# Patient Record
Sex: Female | Born: 1970 | Race: Black or African American | Hispanic: No | Smoking: Never smoker
Health system: Southern US, Community
[De-identification: ages and names within clinical notes are randomized; demographics above are authoritative.]

## PROBLEM LIST (undated history)

## (undated) HISTORY — PX: TUBAL LIGATION: SHX77

## (undated) HISTORY — PX: OTHER SURGICAL HISTORY: SHX169

## (undated) HISTORY — PX: EYE SURGERY: SHX253

---

## 2000-04-18 ENCOUNTER — Other Ambulatory Visit: Admission: RE | Admit: 2000-04-18 | Discharge: 2000-04-18 | Payer: Self-pay | Admitting: *Deleted

## 2000-08-08 ENCOUNTER — Inpatient Hospital Stay (HOSPITAL_COMMUNITY): Admission: AD | Admit: 2000-08-08 | Discharge: 2000-08-08 | Payer: Self-pay | Admitting: Obstetrics and Gynecology

## 2000-10-31 ENCOUNTER — Inpatient Hospital Stay (HOSPITAL_COMMUNITY): Admission: AD | Admit: 2000-10-31 | Discharge: 2000-11-02 | Payer: Self-pay | Admitting: Obstetrics and Gynecology

## 2002-05-15 ENCOUNTER — Other Ambulatory Visit: Admission: RE | Admit: 2002-05-15 | Discharge: 2002-05-15 | Payer: Self-pay | Admitting: Obstetrics and Gynecology

## 2003-07-07 ENCOUNTER — Other Ambulatory Visit: Admission: RE | Admit: 2003-07-07 | Discharge: 2003-07-07 | Payer: Self-pay | Admitting: Obstetrics and Gynecology

## 2004-07-07 ENCOUNTER — Other Ambulatory Visit: Admission: RE | Admit: 2004-07-07 | Discharge: 2004-07-07 | Payer: Self-pay | Admitting: Obstetrics and Gynecology

## 2004-09-03 ENCOUNTER — Ambulatory Visit (HOSPITAL_COMMUNITY): Admission: RE | Admit: 2004-09-03 | Discharge: 2004-09-03 | Payer: Self-pay | Admitting: Obstetrics and Gynecology

## 2004-12-14 ENCOUNTER — Other Ambulatory Visit: Admission: RE | Admit: 2004-12-14 | Discharge: 2004-12-14 | Payer: Self-pay | Admitting: Obstetrics and Gynecology

## 2012-07-15 ENCOUNTER — Emergency Department (HOSPITAL_COMMUNITY)
Admission: EM | Admit: 2012-07-15 | Discharge: 2012-07-15 | Disposition: A | Discharge status: Home / Self Care | Payer: BC Managed Care – PPO | Attending: Emergency Medicine | Admitting: Emergency Medicine

## 2012-07-15 ENCOUNTER — Encounter (HOSPITAL_COMMUNITY): Payer: Self-pay | Admitting: Emergency Medicine

## 2012-07-15 DIAGNOSIS — R1110 Vomiting, unspecified: Secondary | ICD-10-CM

## 2012-07-15 DIAGNOSIS — R109 Unspecified abdominal pain: Secondary | ICD-10-CM | POA: Insufficient documentation

## 2012-07-15 DIAGNOSIS — E860 Dehydration: Secondary | ICD-10-CM | POA: Insufficient documentation

## 2012-07-15 DIAGNOSIS — R197 Diarrhea, unspecified: Secondary | ICD-10-CM | POA: Insufficient documentation

## 2012-07-15 LAB — POCT I-STAT, CHEM 8
BUN: 17 mg/dL (ref 6–23)
Calcium, Ion: 1.23 mmol/L (ref 1.12–1.23)
Creatinine, Ser: 0.9 mg/dL (ref 0.50–1.10)
Glucose, Bld: 120 mg/dL — ABNORMAL HIGH (ref 70–99)
TCO2: 26 mmol/L (ref 0–100)

## 2012-07-15 MED ORDER — SODIUM CHLORIDE 0.9 % IV SOLN: 1000.0000 mL | INTRAVENOUS | Status: DC

## 2012-07-15 MED ORDER — SODIUM CHLORIDE 0.9 % IV SOLN
1000.0000 mL | Freq: Once | INTRAVENOUS | Status: AC
  Administered 2012-07-15: 1000 mL via INTRAVENOUS

## 2012-07-15 MED ORDER — ONDANSETRON HCL 4 MG/2ML IJ SOLN
4.0000 mg | Freq: Once | INTRAMUSCULAR | Status: AC
  Administered 2012-07-15: 4 mg via INTRAVENOUS
  Filled 2012-07-15: qty 2

## 2012-07-15 MED ORDER — LOPERAMIDE HCL 2 MG PO CAPS
4.0000 mg | ORAL_CAPSULE | Freq: Once | ORAL | Status: AC
  Administered 2012-07-15: 4 mg via ORAL
  Filled 2012-07-15: qty 2

## 2012-07-15 MED ORDER — SODIUM CHLORIDE 0.9 % IV BOLUS (SEPSIS)
1000.0000 mL | Freq: Once | INTRAVENOUS | Status: AC
  Administered 2012-07-15: 1000 mL via INTRAVENOUS

## 2012-07-15 MED ORDER — ONDANSETRON 8 MG PO TBDP: 8.0000 mg | ORAL_TABLET | Freq: Three times a day (TID) | ORAL | Status: DC | PRN

## 2012-07-15 NOTE — ED Provider Notes (Signed)
History     CSN: [id number]  Arrival date & time 07/15/12  1140   First MD Initiated Contact with Patient 07/15/12 1213      Chief Complaint  Patient presents with  . Emesis  . Diarrhea  . Abdominal Pain    (Consider location/radiation/quality/duration/timing/severity/associated sxs/prior treatment) HPI  Patient reports about 3 AM she woke up and started having vomiting and diarrhea sometimes both at the same time. She's had about 6 episodes of vomiting about the same diarrhea. She states she the diarrhea is watery. She states she gets abdominal pain before she has vomiting or diarrhea and then it goes away. She denies feeling dizzy or lightheaded but does state she feels weak. She denies having any fever. She reports her daughter had similar symptoms last week.  PCP none  History reviewed. No pertinent past medical history.  Past Surgical History  Procedure Date  . Eye surgery   . Uterine ablation   . Tubal ligation     No family history on file.  History  Substance Use Topics  . Smoking status: Never Smoker   . Smokeless tobacco: Not on file  . Alcohol Use: No  employed Lives at home Lives with spouse  OB History    Grav Para Term Preterm Abortions TAB SAB Ect Mult Living                  Review of Systems  All other systems reviewed and are negative.    Allergies  Review of patient's allergies indicates no known allergies.  Home Medications   none  BP 115/67  Pulse 100  Temp 98.5 F (36.9 C) (Oral)  Resp 16  SpO2 97%  LMP 06/15/2012  Vital signs normal    Physical Exam  Nursing note and vitals reviewed. Constitutional: She is oriented to person, place, and time. She appears well-developed and well-nourished.  Non-toxic appearance. She does not appear ill. No distress.  HENT:  Head: Normocephalic and atraumatic.  Right Ear: External ear normal.  Left Ear: External ear normal.  Nose: Nose normal. No mucosal edema or rhinorrhea.    Mouth/Throat: Mucous membranes are normal. No dental abscesses or uvula swelling.       Dry tongue  Eyes: Conjunctivae normal and EOM are normal. Pupils are equal, round, and reactive to light.  Neck: Normal range of motion and full passive range of motion without pain. Neck supple.  Cardiovascular: Normal rate, regular rhythm and normal heart sounds.  Exam reveals no gallop and no friction rub.   No murmur heard. Pulmonary/Chest: Effort normal and breath sounds normal. No respiratory distress. She has no wheezes. She has no rhonchi. She has no rales. She exhibits no tenderness and no crepitus.  Abdominal: Soft. Normal appearance and bowel sounds are normal. She exhibits no distension. There is tenderness. There is no rebound and no guarding.       Mild diffuse soreness  Musculoskeletal: Normal range of motion. She exhibits no edema and no tenderness.       Moves all extremities well.   Neurological: She is alert and oriented to person, place, and time. She has normal strength. No cranial nerve deficit.  Skin: Skin is warm, dry and intact. No rash noted. No erythema. No pallor.  Psychiatric: She has a normal mood and affect. Her speech is normal and behavior is normal. Her mood appears not anxious.    ED Course  Procedures (including critical care time)   Medications  0.9 %  sodium chloride infusion (0 mL Intravenous Stopped 07/15/12 1416)    Followed by  0.9 %  sodium chloride infusion (not administered)  ondansetron (ZOFRAN ODT) 8 MG disintegrating tablet (not administered)  ondansetron (ZOFRAN) injection 4 mg (4 mg Intravenous Given 07/15/12 1316)  loperamide (IMODIUM) capsule 4 mg (4 mg Oral Given 07/15/12 1317)  sodium chloride 0.9 % bolus 1,000 mL (1000 mL Intravenous New Bag/Given 07/15/12 1425)   14:30 Feels much better, has drank two cups of fluids.   Results for orders placed during the hospital encounter of 07/15/12  POCT I-STAT, CHEM 8      Component Value Range   Sodium  141  135 - 145 mEq/L   Potassium 5.9 (*) 3.5 - 5.1 mEq/L   Chloride 109  96 - 112 mEq/L   BUN 17  6 - 23 mg/dL   Creatinine, Ser 1.61  0.50 - 1.10 mg/dL   Glucose, Bld 096 (*) 70 - 99 mg/dL   Calcium, Ion 0.45  4.09 - 1.23 mmol/L   TCO2 26  0 - 100 mmol/L   Hemoglobin 14.3  12.0 - 15.0 g/dL   HCT 81.1  91.4 - 78.2 %   Laboratory interpretation all normal except hyperkalemia most likely hemolyzed     1. Vomiting and diarrhea   2. Dehydration    New Prescriptions   ONDANSETRON (ZOFRAN ODT) 8 MG DISINTEGRATING TABLET    Take 1 tablet (8 mg total) by mouth every 8 (eight) hours as needed for nausea.    Plan discharge  Devoria Albe, MD, FACEP    MDM          Ward Givens, MD 07/15/12 [phone]

## 2012-07-15 NOTE — ED Notes (Signed)
Pt from home c/o N/V/D and abd pain since 3am. Pt reports that her daughter had "a stomach virus" on Friday. Pt denies blood in stool, emesis, CP or SOB. Pt in NAD and A&O.

## 2017-12-23 ENCOUNTER — Encounter (HOSPITAL_COMMUNITY): Payer: Self-pay | Admitting: Emergency Medicine

## 2017-12-23 ENCOUNTER — Other Ambulatory Visit: Payer: Self-pay

## 2017-12-23 ENCOUNTER — Emergency Department (HOSPITAL_COMMUNITY)
Admission: EM | Admit: 2017-12-23 | Discharge: 2017-12-23 | Disposition: A | Discharge status: Home / Self Care | Payer: 59 | Attending: Emergency Medicine | Admitting: Emergency Medicine

## 2017-12-23 DIAGNOSIS — Z79899 Other long term (current) drug therapy: Secondary | ICD-10-CM | POA: Diagnosis not present

## 2017-12-23 DIAGNOSIS — K0889 Other specified disorders of teeth and supporting structures: Secondary | ICD-10-CM | POA: Insufficient documentation

## 2017-12-23 MED ORDER — HYDROCODONE-ACETAMINOPHEN 5-325 MG PO TABS: 1.0000 | ORAL_TABLET | ORAL | 0 refills | Status: DC | PRN

## 2017-12-23 NOTE — ED Triage Notes (Signed)
Patient states she has an appointment to get tooth pulled on Monday but is having pain on the right lower bottom of her mouth.

## 2017-12-23 NOTE — ED Provider Notes (Signed)
[facility]-EMERGENCY DEPT Provider Note: Julia Dell, MD, FACEP  CSN: [id number] MRN: [id number] ARRIVAL: 12/23/17 at 0303 ROOM: [facility]   CHIEF COMPLAINT  Dental Pain   HISTORY OF PRESENT ILLNESS  12/23/17 3:21 AM Julia Mccullough is a 47 y.o. female who is had pain in her left lower second molar for the past week.  She saw her dentist who told her she could either have the tooth extracted or have a root canal.  She elected to have it extracted which is scheduled to be done the day after tomorrow.  Her pain had been adequately controlled with Aleve at that point but the pain has subsequently worsened and Aleve no longer controls it.  She has also applied Anbesol without adequate relief.  Pain is currently moderate to severe, worse with eating.   History reviewed. No pertinent past medical history.  Past Surgical History:  Procedure Laterality Date  . EYE SURGERY    . TUBAL LIGATION    . uterine ablation      History reviewed. No pertinent family history.  Social History   Tobacco Use  . Smoking status: Never Smoker  . Smokeless tobacco: Never Used  Substance Use Topics  . Alcohol use: No  . Drug use: No    Prior to Admission medications   Medication Sig Start Date End Date Taking? Authorizing Provider  ondansetron (ZOFRAN ODT) 8 MG disintegrating tablet Take 1 tablet (8 mg total) by mouth every 8 (eight) hours as needed for nausea. 07/15/12   Devoria Albe, MD    Allergies Patient has no known allergies.   REVIEW OF SYSTEMS  Negative except as noted here or in the History of Present Illness.   PHYSICAL EXAMINATION  Initial Vital Signs Blood pressure 134/80, pulse 90, temperature 98.7 F (37.1 C), temperature source Oral, resp. rate 16, height 5\' 9"  (1.753 m), weight 80.7 kg (178 lb), SpO2 97 %.  Examination General: Well-developed, well-nourished female in no acute distress; appearance consistent with age of record HENT: normocephalic; atraumatic;  normal-appearing left lower second molar without adjacent swelling or erythema of the gum Eyes: Normal appearance Neck: supple; no lymphadenopathy Heart: regular rate and rhythm Lungs: clear to auscultation bilaterally Abdomen: soft; nondistended; nontender Extremities: No deformity; full range of motion Neurologic: Awake, alert and oriented; motor function intact in all extremities and symmetric; no facial droop Skin: Warm and dry Psychiatric: Normal mood and affect   RESULTS  Summary of this visit's results, reviewed by myself:   EKG Interpretation  Date/Time:    Ventricular Rate:    PR Interval:    QRS Duration:   QT Interval:    QTC Calculation:   R Axis:     Text Interpretation:        Laboratory Studies: No results found for this or any previous visit (from the past 24 hour(s)). Imaging Studies: No results found.  ED COURSE and MDM  Nursing notes and initial vitals signs, including pulse oximetry, reviewed.  Vitals:   12/23/17 0312 12/23/17 0313  BP: 134/80   Pulse: 90   Resp: 16   Temp: 98.7 F (37.1 C)   TempSrc: Oral   SpO2: 97%   Weight:  80.7 kg (178 lb)  Height:  5\' 9"  (1.753 m)   The patient was advised to continue Aleve and we will add a short course of hydrocodone.  Consultation with the [facility] state controlled substances database reveals the patient has received no opioid prescriptions in the past 2 years.  PROCEDURES    ED DIAGNOSES     ICD-10-CM   1. Pain, dental K08.89        Paula LibraMolpus, Ashe Graybeal, MD 12/23/17 [phone]

## 2017-12-30 ENCOUNTER — Emergency Department (HOSPITAL_COMMUNITY)
Admission: EM | Admit: 2017-12-30 | Discharge: 2017-12-30 | Disposition: A | Discharge status: Home / Self Care | Payer: 59 | Attending: Emergency Medicine | Admitting: Emergency Medicine

## 2017-12-30 ENCOUNTER — Encounter (HOSPITAL_COMMUNITY)
Admission: EM | Disposition: A | Discharge status: Home / Self Care | Payer: Self-pay | Source: Home / Self Care | Attending: Emergency Medicine

## 2017-12-30 ENCOUNTER — Emergency Department (HOSPITAL_COMMUNITY): Payer: 59 | Admitting: Certified Registered Nurse Anesthetist

## 2017-12-30 ENCOUNTER — Emergency Department (HOSPITAL_COMMUNITY): Payer: 59

## 2017-12-30 ENCOUNTER — Other Ambulatory Visit: Payer: Self-pay

## 2017-12-30 ENCOUNTER — Encounter (HOSPITAL_COMMUNITY): Payer: Self-pay | Admitting: Emergency Medicine

## 2017-12-30 DIAGNOSIS — L0201 Cutaneous abscess of face: Secondary | ICD-10-CM | POA: Insufficient documentation

## 2017-12-30 DIAGNOSIS — K122 Cellulitis and abscess of mouth: Secondary | ICD-10-CM

## 2017-12-30 DIAGNOSIS — R220 Localized swelling, mass and lump, head: Secondary | ICD-10-CM | POA: Diagnosis present

## 2017-12-30 HISTORY — PX: INCISION AND DRAINAGE ABSCESS: SHX5864

## 2017-12-30 LAB — COMPREHENSIVE METABOLIC PANEL
ALT: 18 U/L (ref 0–44)
ANION GAP: 12 (ref 5–15)
AST: 18 U/L (ref 15–41)
Albumin: 3.6 g/dL (ref 3.5–5.0)
Alkaline Phosphatase: 108 U/L (ref 38–126)
BUN: 12 mg/dL (ref 6–20)
CHLORIDE: 103 mmol/L (ref 98–111)
CO2: 24 mmol/L (ref 22–32)
Calcium: 9.4 mg/dL (ref 8.9–10.3)
Creatinine, Ser: 0.78 mg/dL (ref 0.44–1.00)
Glucose, Bld: 105 mg/dL — ABNORMAL HIGH (ref 70–99)
POTASSIUM: 4.2 mmol/L (ref 3.5–5.1)
SODIUM: 139 mmol/L (ref 135–145)
Total Bilirubin: 0.8 mg/dL (ref 0.3–1.2)
Total Protein: 7.8 g/dL (ref 6.5–8.1)

## 2017-12-30 LAB — CBC WITH DIFFERENTIAL/PLATELET
ABS IMMATURE GRANULOCYTES: 0 10*3/uL (ref 0.0–0.1)
Basophils Absolute: 0 10*3/uL (ref 0.0–0.1)
Basophils Relative: 0 %
Eosinophils Absolute: 0 10*3/uL (ref 0.0–0.7)
Eosinophils Relative: 0 %
HEMATOCRIT: 38.8 % (ref 36.0–46.0)
Hemoglobin: 12.4 g/dL (ref 12.0–15.0)
IMMATURE GRANULOCYTES: 0 %
LYMPHS ABS: 1.9 10*3/uL (ref 0.7–4.0)
LYMPHS PCT: 22 %
MCH: 29 pg (ref 26.0–34.0)
MCHC: 32 g/dL (ref 30.0–36.0)
MCV: 90.9 fL (ref 78.0–100.0)
MONOS PCT: 10 %
Monocytes Absolute: 0.9 10*3/uL (ref 0.1–1.0)
NEUTROS ABS: 5.9 10*3/uL (ref 1.7–7.7)
NEUTROS PCT: 68 %
PLATELETS: 365 10*3/uL (ref 150–400)
RBC: 4.27 MIL/uL (ref 3.87–5.11)
RDW: 12.7 % (ref 11.5–15.5)
WBC: 8.7 10*3/uL (ref 4.0–10.5)

## 2017-12-30 LAB — I-STAT CG4 LACTIC ACID, ED
LACTIC ACID, VENOUS: 0.58 mmol/L (ref 0.5–1.9)
LACTIC ACID, VENOUS: 0.84 mmol/L (ref 0.5–1.9)

## 2017-12-30 LAB — I-STAT BETA HCG BLOOD, ED (MC, WL, AP ONLY): I-stat hCG, quantitative: <5 m[IU]/mL (ref <5)

## 2017-12-30 SURGERY — INCISION AND DRAINAGE, ABSCESS
Anesthesia: General | Site: Neck

## 2017-12-30 MED ORDER — FENTANYL CITRATE (PF) 100 MCG/2ML IJ SOLN
25.0000 ug | INTRAMUSCULAR | Status: DC | PRN
  Administered 2017-12-30: 50 ug via INTRAVENOUS

## 2017-12-30 MED ORDER — VANCOMYCIN HCL 10 G IV SOLR
1500.0000 mg | Freq: Once | INTRAVENOUS | Status: AC
  Administered 2017-12-30: 1500 mg via INTRAVENOUS
  Filled 2017-12-30: qty 1500

## 2017-12-30 MED ORDER — 0.9 % SODIUM CHLORIDE (POUR BTL) OPTIME
TOPICAL | Status: DC | PRN
  Administered 2017-12-30: 1000 mL

## 2017-12-30 MED ORDER — LACTATED RINGERS IV SOLN
INTRAVENOUS | Status: DC
  Administered 2017-12-30: 12:00:00 via INTRAVENOUS

## 2017-12-30 MED ORDER — ONDANSETRON HCL 4 MG/2ML IJ SOLN
INTRAMUSCULAR | Status: AC
  Filled 2017-12-30: qty 2

## 2017-12-30 MED ORDER — OXYCODONE HCL 5 MG PO TABS
5.0000 mg | ORAL_TABLET | Freq: Once | ORAL | Status: AC | PRN
  Administered 2017-12-30: 5 mg via ORAL

## 2017-12-30 MED ORDER — FENTANYL CITRATE (PF) 100 MCG/2ML IJ SOLN
INTRAMUSCULAR | Status: AC
  Administered 2017-12-30: 50 ug via INTRAVENOUS
  Filled 2017-12-30: qty 2

## 2017-12-30 MED ORDER — MIDAZOLAM HCL 2 MG/2ML IJ SOLN
INTRAMUSCULAR | Status: DC | PRN
  Administered 2017-12-30: 2 mg via INTRAVENOUS

## 2017-12-30 MED ORDER — ONDANSETRON HCL 4 MG/2ML IJ SOLN
INTRAMUSCULAR | Status: DC | PRN
  Administered 2017-12-30: 4 mg via INTRAVENOUS

## 2017-12-30 MED ORDER — SUCCINYLCHOLINE CHLORIDE 200 MG/10ML IV SOSY
PREFILLED_SYRINGE | INTRAVENOUS | Status: AC
  Filled 2017-12-30: qty 10

## 2017-12-30 MED ORDER — MIDAZOLAM HCL 2 MG/2ML IJ SOLN
INTRAMUSCULAR | Status: AC
  Filled 2017-12-30: qty 2

## 2017-12-30 MED ORDER — PROPOFOL 10 MG/ML IV BOLUS
INTRAVENOUS | Status: AC
  Filled 2017-12-30: qty 20

## 2017-12-30 MED ORDER — SUCCINYLCHOLINE CHLORIDE 200 MG/10ML IV SOSY
PREFILLED_SYRINGE | INTRAVENOUS | Status: DC | PRN
  Administered 2017-12-30: 60 mg via INTRAVENOUS

## 2017-12-30 MED ORDER — PHENYLEPHRINE HCL 10 MG/ML IJ SOLN
INTRAMUSCULAR | Status: DC | PRN
  Administered 2017-12-30: 80 ug via INTRAVENOUS

## 2017-12-30 MED ORDER — FENTANYL CITRATE (PF) 250 MCG/5ML IJ SOLN
INTRAMUSCULAR | Status: AC
  Filled 2017-12-30: qty 5

## 2017-12-30 MED ORDER — DEXAMETHASONE SODIUM PHOSPHATE 10 MG/ML IJ SOLN
10.0000 mg | Freq: Once | INTRAMUSCULAR | Status: AC
  Administered 2017-12-30: 10 mg via INTRAVENOUS
  Filled 2017-12-30: qty 1

## 2017-12-30 MED ORDER — OXYCODONE HCL 5 MG PO TABS
ORAL_TABLET | ORAL | Status: AC
  Administered 2017-12-30: 5 mg via ORAL
  Filled 2017-12-30: qty 1

## 2017-12-30 MED ORDER — LIDOCAINE-EPINEPHRINE 1 %-1:100000 IJ SOLN
INTRAMUSCULAR | Status: DC | PRN
  Administered 2017-12-30: 1.5 mL

## 2017-12-30 MED ORDER — FENTANYL CITRATE (PF) 250 MCG/5ML IJ SOLN
INTRAMUSCULAR | Status: DC | PRN
  Administered 2017-12-30: 100 ug via INTRAVENOUS
  Administered 2017-12-30 (×2): 50 ug via INTRAVENOUS

## 2017-12-30 MED ORDER — PROPOFOL 10 MG/ML IV BOLUS
INTRAVENOUS | Status: DC | PRN
  Administered 2017-12-30: 80 mg via INTRAVENOUS
  Administered 2017-12-30: 30 mg via INTRAVENOUS

## 2017-12-30 MED ORDER — VANCOMYCIN HCL 10 G IV SOLR: 1250.0000 mg | INTRAVENOUS | Status: DC

## 2017-12-30 MED ORDER — OXYCODONE HCL 5 MG/5ML PO SOLN: 5.0000 mg | Freq: Once | ORAL | Status: AC | PRN

## 2017-12-30 MED ORDER — LIDOCAINE-EPINEPHRINE 1 %-1:100000 IJ SOLN
INTRAMUSCULAR | Status: AC
  Filled 2017-12-30: qty 1

## 2017-12-30 MED ORDER — IOHEXOL 300 MG/ML  SOLN
75.0000 mL | Freq: Once | INTRAMUSCULAR | Status: AC | PRN
  Administered 2017-12-30: 75 mL via INTRAVENOUS

## 2017-12-30 MED ORDER — PIPERACILLIN-TAZOBACTAM 3.375 G IVPB 30 MIN
3.3750 g | Freq: Once | INTRAVENOUS | Status: AC
  Administered 2017-12-30: 3.375 g via INTRAVENOUS
  Filled 2017-12-30: qty 50

## 2017-12-30 SURGICAL SUPPLY — 41 items
BLADE SURG 15 STRL LF DISP TIS (BLADE) IMPLANT
BLADE SURG 15 STRL SS (BLADE) ×2
BNDG CONFORM 2 STRL LF (GAUZE/BANDAGES/DRESSINGS) ×2 IMPLANT
BNDG GAUZE ELAST 4 BULKY (GAUZE/BANDAGES/DRESSINGS) ×1 IMPLANT
CATH ROBINSON RED A/P 12FR (CATHETERS) ×2 IMPLANT
COVER SURGICAL LIGHT HANDLE (MISCELLANEOUS) ×4 IMPLANT
CRADLE DONUT ADULT HEAD (MISCELLANEOUS) ×1 IMPLANT
DECANTER SPIKE VIAL GLASS SM (MISCELLANEOUS) ×1 IMPLANT
DRAIN PENROSE 1/4X12 LTX STRL (WOUND CARE) ×1 IMPLANT
DRAPE HALF SHEET 40X57 (DRAPES) IMPLANT
DRAPE ORTHO SPLIT 77X108 STRL (DRAPES) ×2
DRAPE SURG ORHT 6 SPLT 77X108 (DRAPES) ×1 IMPLANT
DRSG PAD ABDOMINAL 8X10 ST (GAUZE/BANDAGES/DRESSINGS) IMPLANT
ELECT COATED BLADE 2.86 ST (ELECTRODE) ×2 IMPLANT
ELECT REM PT RETURN 9FT ADLT (ELECTROSURGICAL) ×2
ELECTRODE REM PT RTRN 9FT ADLT (ELECTROSURGICAL) ×1 IMPLANT
GAUZE SPONGE 4X4 12PLY STRL (GAUZE/BANDAGES/DRESSINGS) IMPLANT
GAUZE SPONGE 4X4 16PLY XRAY LF (GAUZE/BANDAGES/DRESSINGS) ×2 IMPLANT
GLOVE BIO SURGEON STRL SZ7.5 (GLOVE) ×2 IMPLANT
GOWN STRL REUS W/ TWL LRG LVL3 (GOWN DISPOSABLE) ×1 IMPLANT
GOWN STRL REUS W/TWL LRG LVL3 (GOWN DISPOSABLE) ×2
KIT BASIN OR (CUSTOM PROCEDURE TRAY) ×2 IMPLANT
KIT TURNOVER KIT B (KITS) ×2 IMPLANT
MARKER SKIN DUAL TIP RULER LAB (MISCELLANEOUS) ×2 IMPLANT
NDL HYPO 25GX1X1/2 BEV (NEEDLE) ×1 IMPLANT
NEEDLE HYPO 25GX1X1/2 BEV (NEEDLE) ×2 IMPLANT
NS IRRIG 1000ML POUR BTL (IV SOLUTION) ×2 IMPLANT
PACK SURGICAL SETUP 50X90 (CUSTOM PROCEDURE TRAY) ×2 IMPLANT
PAD ARMBOARD 7.5X6 YLW CONV (MISCELLANEOUS) ×4 IMPLANT
PENCIL BUTTON HOLSTER BLD 10FT (ELECTRODE) ×2 IMPLANT
RUBBERBAND STERILE (MISCELLANEOUS) IMPLANT
SUT ETHILON 2 0 FS 18 (SUTURE) ×2 IMPLANT
SUT SILK 2 0 SH CR/8 (SUTURE) IMPLANT
SWAB COLLECTION DEVICE MRSA (MISCELLANEOUS) ×2 IMPLANT
SWAB CULTURE ESWAB REG 1ML (MISCELLANEOUS) ×2 IMPLANT
SYR BULB IRRIGATION 50ML (SYRINGE) ×2 IMPLANT
SYR CONTROL 10ML LL (SYRINGE) IMPLANT
TAPE SURG TRANSPORE 1 IN (GAUZE/BANDAGES/DRESSINGS) IMPLANT
TAPE SURGICAL TRANSPORE 1 IN (GAUZE/BANDAGES/DRESSINGS) ×1
TUBE CONNECTING 12X1/4 (SUCTIONS) ×2 IMPLANT
YANKAUER SUCT BULB TIP NO VENT (SUCTIONS) ×2 IMPLANT

## 2017-12-30 NOTE — ED Provider Notes (Signed)
[facility] EMERGENCY DEPARTMENT Provider Note   CSN: [id number] Arrival date & time: 12/30/17  4098     History   Chief Complaint Chief Complaint  Patient presents with  . Oral Swelling    HPI Julia Mccullough is a 47 y.o. female.  HPI 47 year old female with past medical history of recent tooth extraction 2-1/2 weeks ago here with ongoing tooth and neck pain.  The patient states that she had her left lower molar removed earlier this month.  She had increased swelling and has been on and off.  She saw her oral surgeon this week and was given clindamycin and IV steroids.  Since then, she has had moderate, mild, initially gradual worsening of her pain but over the last 24 hours, she developed rapid swelling and pain under her tongue and jaw.  She said associated difficulties and change in her voice.  Denies any shortness of breath.  She feels like the symptoms are fairly rapidly progressing.  Denies any current shortness of breath or stridor.  No fevers.  She said no nausea or vomiting.  Her last meal was this morning.  She is been unable to eat anything other than liquids for the last several weeks.  History reviewed. No pertinent past medical history.  There are no active problems to display for this patient.   Past Surgical History:  Procedure Laterality Date  . EYE SURGERY    . TUBAL LIGATION    . uterine ablation       OB History   None      Home Medications    Prior to Admission medications   Medication Sig Start Date End Date Taking? Authorizing Provider  HYDROcodone-acetaminophen (NORCO) 5-325 MG tablet Take 1 tablet by mouth every 4 (four) hours as needed for severe pain. 12/23/17   Molpus, John, MD    Family History No family history on file.  Social History Social History   Tobacco Use  . Smoking status: Never Smoker  . Smokeless tobacco: Never Used  Substance Use Topics  . Alcohol use: No  . Drug use: No     Allergies   Patient  has no known allergies.   Review of Systems Review of Systems  Constitutional: Positive for fatigue. Negative for chills and fever.  HENT: Positive for dental problem, drooling, sore throat, trouble swallowing and voice change. Negative for congestion and rhinorrhea.   Eyes: Negative for visual disturbance.  Respiratory: Negative for cough, shortness of breath and wheezing.   Cardiovascular: Negative for chest pain and leg swelling.  Gastrointestinal: Negative for abdominal pain, diarrhea, nausea and vomiting.  Genitourinary: Negative for dysuria, flank pain, vaginal bleeding and vaginal discharge.  Musculoskeletal: Negative for neck pain.  Skin: Negative for rash.  Allergic/Immunologic: Negative for immunocompromised state.  Neurological: Negative for syncope and headaches.  Hematological: Does not bruise/bleed easily.  All other systems reviewed and are negative.    Physical Exam Updated Vital Signs BP 115/63 (BP Location: Right Arm)   Pulse 95   Temp 98.2 F (36.8 C) (Oral)   Resp 17   LMP 12/05/2017 (Approximate)   SpO2 98%   Physical Exam  Constitutional: She is oriented to person, place, and time. She appears well-developed and well-nourished. No distress.  HENT:  Head: Normocephalic and atraumatic.  Left molar operative site appears intact with no appreciable gingival abscess.  There is marketed, firm induration of the sublingual and submandibular space.  Significant, 2 finger trismus.  Difficult to assess posterior  oropharynx due to trismus.  No stridor.  Eyes: Conjunctivae are normal.  Neck: Neck supple.  Cardiovascular: Normal rate, regular rhythm and normal heart sounds. Exam reveals no friction rub.  No murmur heard. Pulmonary/Chest: Effort normal and breath sounds normal. No respiratory distress. She has no wheezes. She has no rales.  Abdominal: She exhibits no distension.  Musculoskeletal: She exhibits no edema.  Neurological: She is alert and oriented to  person, place, and time. She exhibits normal muscle tone.  Skin: Skin is warm. Capillary refill takes less than 2 seconds.  Psychiatric: She has a normal mood and affect.  Nursing note and vitals reviewed.    ED Treatments / Results  Labs (all labs ordered are listed, but only abnormal results are displayed) Labs Reviewed  COMPREHENSIVE METABOLIC PANEL - Abnormal; Notable for the following components:      Result Value   Glucose, Bld 105 (*)    All other components within normal limits  CULTURE, BLOOD (ROUTINE X 2)  CULTURE, BLOOD (ROUTINE X 2)  CBC WITH DIFFERENTIAL/PLATELET  I-STAT CG4 LACTIC ACID, ED  I-STAT BETA HCG BLOOD, ED ([facility], [facility], [facility] ONLY)  I-STAT CG4 LACTIC ACID, ED    EKG None  Radiology Ct Soft Tissue Neck W Contrast  Result Date: 12/30/2017 CLINICAL DATA:  Neck pain with infection suspected EXAM: CT NECK WITH CONTRAST TECHNIQUE: Multidetector CT imaging of the neck was performed using the standard protocol following the bolus administration of intravenous contrast. CONTRAST:  75mL OMNIPAQUE IOHEXOL 300 MG/ML  SOLN COMPARISON:  None. FINDINGS: Pharynx and larynx: There is a low-density tract best seen on reformats extending from a left lower molar alveolus and periapical erosion that decompresses through the medial mandibular cortex. Tract leads to a collection in the floor of mouth measuring 6 x 12 x 21 mm. The floor of mouth is edematous and thickened. Edema continues into the epiglottis which is thickened and blunted. The airway is currently patent Salivary glands: Negative Thyroid: Negative Lymph nodes: Adenitis greater on the left Vascular: Major veins are patent Limited intracranial: Negative Visualized orbits: Negative Mastoids and visualized paranasal sinuses: Negative Skeleton: Left lower mandibular alveolus with periapical erosion decompressing through the medial cortex as described above Upper chest: Negative. Other: These results were called by telephone at the  time of interpretation on 12/30/2017 at 10:57 am to Dr. Shaune PollackAMERON Sebron Mcmahill , who verbally acknowledged these results. 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 IMPRESSION: Odontogenic infection from the empty left lower molar alveolus with sinus tract extending into the floor of mouth where there is 21 x 12 x 6 mm abscess. Floor of mouth cellulitis with edema extending into the thickened epiglottis. Electronically Signed   By: Marnee SpringJonathon  Watts M.D.   On: 12/30/2017 11:01    Procedures .Critical Care Performed by: Shaune PollackIsaacs, Geryl Dohn, MD Authorized by: Shaune PollackIsaacs, Iracema Lanagan, MD   Critical care provider statement:    Critical care time (minutes):  35   Critical care time was exclusive of:  Separately billable procedures and treating other patients and teaching time   Critical care was necessary to treat or prevent imminent or life-threatening deterioration of the following conditions:  Sepsis and respiratory failure   Critical care was time spent personally by me on the following activities:  Development of treatment plan with patient or surrogate, discussions with consultants, evaluation of patient's response to treatment, examination of patient, obtaining history from patient or surrogate, ordering and performing treatments and interventions, ordering and review of laboratory studies, ordering and review  of radiographic studies, pulse oximetry, re-evaluation of patient's condition and review of old charts   I assumed direction of critical care for this patient from another provider in my specialty: no     (including critical care time)  Medications Ordered in ED Medications  vancomycin (VANCOCIN) 1,500 mg in sodium chloride 0.9 % 500 mL IVPB (1,500 mg Intravenous New Bag/Given 12/30/17 1052)  vancomycin (VANCOCIN) 1,250 mg in sodium chloride 0.9 % 250 mL IVPB (has no administration in time range)  dexamethasone (DECADRON) injection 10 mg (10 mg Intravenous Given 12/30/17 1016)  piperacillin-tazobactam  (ZOSYN) IVPB 3.375 g (0 g Intravenous Stopped 12/30/17 1047)  iohexol (OMNIPAQUE) 300 MG/ML solution 75 mL (75 mLs Intravenous Contrast Given 12/30/17 1025)     Initial Impression / Assessment and Plan / ED Course  I have reviewed the triage vital signs and the nursing notes.  Pertinent labs & imaging results that were available during my care of the patient were reviewed by me and considered in my medical decision making (see chart for details).     47 year old female here with sublingual edema and swelling after recent dental procedure.  Procedure was initially done by a Dr. Ladona Ridgel and she was seen by a Dr. Justice Britain this week and placed on antibiotics.  Clinically, concern for early lead weeks angina.  Patient taken emergently to CT which confirms significant sublingual abscess extending just to the epiglottis.  Discussed with Dr. Jenne Pane of ENT who will take the patient to the operating room.  IV antibiotics given.  Airway remains intact at this time.  Final Clinical Impressions(s) / ED Diagnoses   Final diagnoses:  Ludwig's angina    ED Discharge Orders    None       Shaune Pollack, MD 12/30/17 1117

## 2017-12-30 NOTE — Anesthesia Procedure Notes (Signed)
Procedure Name: Intubation Date/Time: 12/30/2017 1:14 PM Performed by: Dairl PonderJiang, Maika Kaczmarek, CRNA Pre-anesthesia Checklist: Patient identified, Emergency Drugs available, Suction available, Patient being monitored and Timeout performed Patient Re-evaluated:Patient Re-evaluated prior to induction Oxygen Delivery Method: Circle system utilized Preoxygenation: Pre-oxygenation with 100% oxygen Induction Type: IV induction Ventilation: Mask ventilation without difficulty and Two handed mask ventilation required Laryngoscope Size: Glidescope and 3 (limited mouth opening) Grade View: Grade I Tube type: Oral Tube size: 7.0 mm Number of attempts: 1 Airway Equipment and Method: Stylet Placement Confirmation: ETT inserted through vocal cords under direct vision,  positive ETCO2 and breath sounds checked- equal and bilateral Secured at: 23 cm Tube secured with: Tape Dental Injury: Teeth and Oropharynx as per pre-operative assessment

## 2017-12-30 NOTE — ED Notes (Signed)
Report called to AlexandriaRobbie, RN in pre-op

## 2017-12-30 NOTE — ED Triage Notes (Signed)
Pt reports she had a tooth pulled last week, used ice as directed by her dentist, noticed that it seemed like she had an infection so she saw oral surgeon who gave her steroids, ivm and clindamycin, stated symptoms were improving until last night when she noticed swelling around her jaw and neck area. Pt denies sob, reports difficulty swallowing.

## 2017-12-30 NOTE — Brief Op Note (Signed)
12/30/2017  1:29 PM  PATIENT:  Julia Mccullough  47 y.o. female  PRE-OPERATIVE DIAGNOSIS:  Submental abscess  POST-OPERATIVE DIAGNOSIS:  Submental abscess  PROCEDURE:  Procedure(s): INCISION AND DRAINAGE NECK ABSCESS (N/A)  SURGEON:  Surgeon(s) and Role:    Christia Reading* Jonathan Corpus, MD - Primary  PHYSICIAN ASSISTANT:   ASSISTANTS: none   ANESTHESIA:   general  EBL: None  BLOOD ADMINISTERED:none  DRAINS: Penrose drain in the submental neck   LOCAL MEDICATIONS USED:  LIDOCAINE   SPECIMEN:  Source of Specimen:  purulent fluid from abscess  DISPOSITION OF SPECIMEN:  MICRO  COUNTS:  YES  TOURNIQUET:  * No tourniquets in log *  DICTATION: .Other Dictation: Dictation Number [id number]  PLAN OF CARE: Discharge to home after PACU  PATIENT DISPOSITION:  PACU - hemodynamically stable.   Delay start of Pharmacological VTE agent (>24hrs) due to surgical blood loss or risk of bleeding: no

## 2017-12-30 NOTE — Consult Note (Signed)
Reason for Consult: Neck infection Referring Physician: ER  Julia Mccullough is an 47 y.o. female.  HPI: 47 year old female developed tooth pain 6/15 and underwent dental extraction a couple of days later.  She had some continued pain and swelling afterwards and her dentist prescribed amoxicillin.  Without improvement and with worsening of neck swelling, she was referred to Dr. Bari Mantis, oral surgery, who saw her two days ago and prescribed clindamycin.  Swelling increased quite a lot last night, however, and she called her oral surgeon back this morning who told her to come to the ER.  A  CT scan demonstrates a submental abscess.  History reviewed. No pertinent past medical history.  Past Surgical History:  Procedure Laterality Date  . EYE SURGERY    . TUBAL LIGATION    . uterine ablation      No family history on file.  Social History:  reports that she has never smoked. She has never used smokeless tobacco. She reports that she does not drink alcohol or use drugs.  Allergies: No Known Allergies  Medications: I have reviewed the patient's current medications.  Results for orders placed or performed during the hospital encounter of 12/30/17 (from the past 48 hour(s))  Comprehensive metabolic panel     Status: Abnormal   Collection Time: 12/30/17  7:26 AM  Result Value Ref Range   Sodium 139 135 - 145 mmol/L   Potassium 4.2 3.5 - 5.1 mmol/L   Chloride 103 98 - 111 mmol/L    Comment: Please note change in reference range.   CO2 24 22 - 32 mmol/L   Glucose, Bld 105 (H) 70 - 99 mg/dL    Comment: Please note change in reference range.   BUN 12 6 - 20 mg/dL    Comment: Please note change in reference range.   Creatinine, Ser 0.78 0.44 - 1.00 mg/dL   Calcium 9.4 8.9 - 10.3 mg/dL   Total Protein 7.8 6.5 - 8.1 g/dL   Albumin 3.6 3.5 - 5.0 g/dL   AST 18 15 - 41 U/L   ALT 18 0 - 44 U/L    Comment: Please note change in reference range.   Alkaline Phosphatase 108 38 - 126 U/L   Total  Bilirubin 0.8 0.3 - 1.2 mg/dL   GFR calc non Af Amer >60 >60 mL/min   GFR calc Af Amer >60 >60 mL/min    Comment: (NOTE) The eGFR has been calculated using the CKD EPI equation. This calculation has not been validated in all clinical situations. eGFR's persistently <60 mL/min signify possible Chronic Kidney Disease.    Anion gap 12 5 - 15    Comment: Performed at [facility] [address] [place], [place] [address]  CBC with Differential     Status: None   Collection Time: 12/30/17  7:26 AM  Result Value Ref Range   WBC 8.7 4.0 - 10.5 K/uL   RBC 4.27 3.87 - 5.11 MIL/uL   Hemoglobin 12.4 12.0 - 15.0 g/dL   HCT 38.8 36.0 - 46.0 %   MCV 90.9 78.0 - 100.0 fL   MCH 29.0 26.0 - 34.0 pg   MCHC 32.0 30.0 - 36.0 g/dL   RDW 12.7 11.5 - 15.5 %   Platelets 365 150 - 400 K/uL   Neutrophils Relative % 68 %   Neutro Abs 5.9 1.7 - 7.7 K/uL   Lymphocytes Relative 22 %   Lymphs Abs 1.9 0.7 - 4.0 K/uL  Monocytes Relative 10 %   Monocytes Absolute 0.9 0.1 - 1.0 K/uL   Eosinophils Relative 0 %   Eosinophils Absolute 0.0 0.0 - 0.7 K/uL   Basophils Relative 0 %   Basophils Absolute 0.0 0.0 - 0.1 K/uL   Immature Granulocytes 0 %   Abs Immature Granulocytes 0.0 0.0 - 0.1 K/uL    Comment: Performed at [facility] [address] [place], [place] [address]  I-Stat beta hCG blood, ED     Status: None   Collection Time: 12/30/17  7:30 AM  Result Value Ref Range   I-stat hCG, quantitative <5.0 <5 mIU/mL   Comment 3            Comment:   GEST. AGE      CONC.  (mIU/mL)   <=1 WEEK        5 - 50     2 WEEKS       50 - 500     3 WEEKS       100 - 10,000     4 WEEKS     1,000 - 30,000        FEMALE AND NON-PREGNANT FEMALE:     LESS THAN 5 mIU/mL   I-Stat CG4 Lactic Acid, ED     Status: None   Collection Time: 12/30/17  7:33 AM  Result Value Ref Range   Lactic Acid, Venous 0.58 0.5 - 1.9 mmol/L  I-Stat CG4 Lactic Acid, ED     Status: None   Collection Time: 12/30/17  9:58 AM   Result Value Ref Range   Lactic Acid, Venous 0.84 0.5 - 1.9 mmol/L    Ct Soft Tissue Neck W Contrast  Result Date: 12/30/2017 CLINICAL DATA:  Neck pain with infection suspected EXAM: CT NECK WITH CONTRAST TECHNIQUE: Multidetector CT imaging of the neck was performed using the standard protocol following the bolus administration of intravenous contrast. CONTRAST:  50m OMNIPAQUE IOHEXOL 300 MG/ML  SOLN COMPARISON:  None. FINDINGS: Pharynx and larynx: There is a low-density tract best seen on reformats extending from a left lower molar alveolus and periapical erosion that decompresses through the medial mandibular cortex. Tract leads to a collection in the floor of mouth measuring 6 x 12 x 21 mm. The floor of mouth is edematous and thickened. Edema continues into the epiglottis which is thickened and blunted. The airway is currently patent Salivary glands: Negative Thyroid: Negative Lymph nodes: Adenitis greater on the left Vascular: Major veins are patent Limited intracranial: Negative Visualized orbits: Negative Mastoids and visualized paranasal sinuses: Negative Skeleton: Left lower mandibular alveolus with periapical erosion decompressing through the medial cortex as described above Upper chest: Negative. Other: These results were called by telephone at the time of interpretation on 12/30/2017 at 10:57 am to Dr. CDuffy Bruce, who verbally acknowledged these results. 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 IMPRESSION: Odontogenic infection from the empty left lower molar alveolus with sinus tract extending into the floor of mouth where there is 21 x 12 x 6 mm abscess. Floor of mouth cellulitis with edema extending into the thickened epiglottis. Electronically Signed   By: JMonte FantasiaM.D.   On: 12/30/2017 11:01    Review of Systems  All other systems reviewed and are negative.  Blood pressure 115/63, pulse 95, temperature 98.2 F (36.8 C), temperature source Oral, resp. rate 17, last  menstrual period 12/05/2017, SpO2 98 %. Physical Exam  Constitutional: She is oriented to person, place, and time. She  appears well-developed and well-nourished. No distress.  HENT:  Head: Normocephalic and atraumatic.  Right Ear: External ear normal.  Left Ear: External ear normal.  Nose: Nose normal.  Mouth/Throat: Oropharynx is clear and moist.  Marked trismus.  Floor of mouth and tongue normal.  No stridor.  Normal voice.  Eyes: Pupils are equal, round, and reactive to light. Conjunctivae and EOM are normal.  Neck:  Submental firm, tender edema.  Cardiovascular: Normal rate.  Respiratory: Effort normal.  Musculoskeletal: Normal range of motion.  Neurological: She is alert and oriented to person, place, and time. No cranial nerve deficit.  Skin: Skin is warm and dry.  Psychiatric: She has a normal mood and affect. Her behavior is normal. Judgment and thought content normal.    Assessment/Plan: Submental abscess  I personally reviewed the neck CT scan demonstrating a well-defined abscess in the submental space.  I discussed the situation with the patient and recommended proceeding with incision and drainage through a neck incision.  Risks, benefits, and alternatives were discussed and she expressed understanding and agreement.  Julia Mccullough 12/30/2017, 11:22 AM

## 2017-12-30 NOTE — Progress Notes (Signed)
Pharmacy Antibiotic Note  Corliss BlackerKertina Amey is a 47 y.o. female admitted on 12/30/2017 with oral swelling.  Patient underwent tooth extraction one week PTA and was given steroid and clindamycin.   Pharmacy has been consulted for vancomycin dosing.  Renal function stable, afebrile, WBC WNL, LA 0.58.   Plan: Vanc 1500mg  IV x 1, then 1250mg  IV Q12H Monitor renal fxn, clinical progress, vanc trough as indicated F/U with continuation of Gram negative/anaerobic coverage >> consider Unasyn     Temp (24hrs), Avg:98.6 F (37 C), Min:98.2 F (36.8 C), Max:98.9 F (37.2 C)  Recent Labs  Lab 12/30/17 0726 12/30/17 0733  WBC 8.7  --   CREATININE 0.78  --   LATICACIDVEN  --  0.58    Estimated Creatinine Clearance: 99.9 mL/min (by C-G formula based on SCr of 0.78 mg/dL).    No Known Allergies   Vanc 6/29 >>  6/29 BCx -    Nabil Bubolz D. Laney Potashang, PharmD, BCPS, BCCCP Pager:  [phone] 12/30/2017, 9:45 AM

## 2017-12-30 NOTE — Op Note (Signed)
NAMCorliss Blacker: [place], [place] MEDICAL RECORD [id number] ACCOUNT [id number] DATE OF BIRTH:[birth year] FACILITY: [facility] LOCATION: [facility] PHYSICIAN:Sharnelle Cappelli Pearletha Alfred. Dannelle Rhymes, MD  OPERATIVE REPORT  DATE OF PROCEDURE:  12/30/2017  PREOPERATIVE DIAGNOSIS:  Submental abscess.  POSTOPERATIVE DIAGNOSIS:  Submental abscess.  PROCEDURE:  Incision and drainage of submental abscess.  SURGEON:  Christia Readingwight Haruna Rohlfs, MD  ANESTHESIA:  General endotracheal anesthesia.  COMPLICATIONS:  None.  INDICATIONS:  The patient is a 47 year old female who developed pain in a tooth about 2 weeks ago that was extracted within a couple of days.  However, she continued to have pain and swelling that did not respond initially to amoxicillin and extended  down into the submental neck space.  She was seen by an oral surgeon 2 days ago and started on clindamycin, but swelling has worsened dramatically in the past 24 hours.  She presents to the emergency department where CT scan demonstrated a well-defined  submental abscess.  She presents to the operating room for surgical management.  FINDINGS:  Dissecting into the center of the area of edema in the submental space, a pocket of pus was encountered and was able to be drained.  Cultures were sent.  DESCRIPTION OF PROCEDURE:  The patient was identified in the holding room, and informed consent having been obtained including discussion of risks, benefits, alternatives, the patient was brought to the operative suite, placed on the operating table in  supine position.  Anesthesia was induced, and the patient was intubated by the anesthesia team without difficulty using a GlideScope.  A shoulder roll was placed, and the neck incision was marked with a marking pen and injected with 1% lidocaine with  1:100,000 of epinephrine.  The neck was prepped and draped in sterile fashion.  An incision was made through the skin with a 15-blade scalpel and extended bluntly using a tonsil clamp through the  midline of the submental space until a pocket was  encountered.  Pus immediately flowed.  Cultures were obtained.  The cavity was copiously irrigated with saline using a red rubber catheter.  A quarter inch Penrose drain was placed in the depth of the abscess and secured to the skin using a single 2-0  nylon suture.  Drapes were removed.  The patient was cleaned off.  A Kerlix fluff dressing was placed around the neck.  She was then returned to anesthesia for wakeup and was extubated in the recovery room in stable condition.  LN/NUANCE  D:12/30/2017 T:12/30/2017 JOB:[id number]

## 2017-12-30 NOTE — Anesthesia Preprocedure Evaluation (Signed)
Anesthesia Evaluation  Patient identified by MRN, date of birth, ID band Patient awake    Reviewed: Allergy & Precautions, NPO status , Patient's Chart, lab work & pertinent test results  History of Anesthesia Complications Negative for: history of anesthetic complications  Airway Mallampati: IV  TM Distance: >3 FB Neck ROM: Full  Mouth opening: Limited Mouth Opening  Dental  (+) Teeth Intact   Pulmonary neg pulmonary ROS,    breath sounds clear to auscultation       Cardiovascular negative cardio ROS   Rhythm:Regular     Neuro/Psych negative neurological ROS  negative psych ROS   GI/Hepatic negative GI ROS, Neg liver ROS,   Endo/Other  negative endocrine ROS  Renal/GU negative Renal ROS     Musculoskeletal   Abdominal   Peds  Hematology negative hematology ROS (+)   Anesthesia Other Findings minimal mouth opening due to swelling and pain   Reproductive/Obstetrics                             Anesthesia Physical Anesthesia Plan  ASA: I  Anesthesia Plan: General   Post-op Pain Management:    Induction: Intravenous  PONV Risk Score and Plan: 3 and Ondansetron and Dexamethasone  Airway Management Planned: Oral ETT, Nasal ETT, Fiberoptic Intubation Planned, Video Laryngoscope Planned and Awake Intubation Planned  Additional Equipment: None  Intra-op Plan:   Post-operative Plan: Extubation in OR and Possible Post-op intubation/ventilation  Informed Consent: I have reviewed the patients History and Physical, chart, labs and discussed the procedure including the risks, benefits and alternatives for the proposed anesthesia with the patient or authorized representative who has indicated his/her understanding and acceptance.   Dental advisory given  Plan Discussed with: CRNA and Surgeon  Anesthesia Plan Comments:         Anesthesia Quick Evaluation

## 2017-12-30 NOTE — Transfer of Care (Signed)
Immediate Anesthesia Transfer of Care Note  Patient: Julia Mccullough  Procedure(s) Performed: INCISION AND DRAINAGE NECK ABSCESS (N/A Neck)  Patient Location: PACU  Anesthesia Type:General  Level of Consciousness: awake, alert  and oriented  Airway & Oxygen Therapy: Patient Spontanous Breathing and Patient connected to nasal cannula oxygen  Post-op Assessment: Report given to RN and Post -op Vital signs reviewed and stable  Post vital signs: Reviewed and stable  Last Vitals:  Vitals Value Taken Time  BP 125/67 12/30/2017  1:46 PM  Temp    Pulse 96 12/30/2017  1:48 PM  Resp 15 12/30/2017  1:48 PM  SpO2 100 % 12/30/2017  1:48 PM  Vitals shown include unvalidated device data.  Last Pain:  Vitals:   12/30/17 0919  TempSrc: Oral  PainSc:          Complications: No apparent anesthesia complications

## 2017-12-31 NOTE — Anesthesia Postprocedure Evaluation (Signed)
Anesthesia Post Note  Patient: Julia Mccullough  Procedure(s) Performed: INCISION AND DRAINAGE NECK ABSCESS (N/A Neck)     Patient location during evaluation: PACU Anesthesia Type: General Level of consciousness: awake and alert Pain management: pain level controlled Vital Signs Assessment: post-procedure vital signs reviewed and stable Respiratory status: spontaneous breathing, nonlabored ventilation, respiratory function stable and patient connected to nasal cannula oxygen Cardiovascular status: blood pressure returned to baseline and stable Postop Assessment: no apparent nausea or vomiting Anesthetic complications: no    Last Vitals:  Vitals:   12/30/17 1415 12/30/17 1445  BP: 115/79 109/70  Pulse: 89   Resp: 15   Temp:    SpO2: 100%     Last Pain:  Vitals:   12/30/17 1445  TempSrc:   PainSc: 3                  Cutter Passey

## 2018-01-01 ENCOUNTER — Encounter (HOSPITAL_COMMUNITY): Payer: Self-pay | Admitting: Otolaryngology

## 2018-01-01 DIAGNOSIS — L0201 Cutaneous abscess of face: Secondary | ICD-10-CM | POA: Insufficient documentation

## 2018-01-04 LAB — AEROBIC/ANAEROBIC CULTURE W GRAM STAIN (SURGICAL/DEEP WOUND)

## 2018-01-04 LAB — CULTURE, BLOOD (ROUTINE X 2)
Culture: NO GROWTH
Culture: NO GROWTH
SPECIAL REQUESTS: ADEQUATE

## 2018-01-04 LAB — AEROBIC/ANAEROBIC CULTURE (SURGICAL/DEEP WOUND)

## 2018-12-20 ENCOUNTER — Ambulatory Visit (INDEPENDENT_AMBULATORY_CARE_PROVIDER_SITE_OTHER): Payer: 59 | Admitting: Podiatry

## 2018-12-20 ENCOUNTER — Other Ambulatory Visit: Payer: Self-pay

## 2018-12-20 VITALS — BP 102/63 | HR 93 | Temp 97.2°F

## 2018-12-20 DIAGNOSIS — L309 Dermatitis, unspecified: Secondary | ICD-10-CM | POA: Diagnosis not present

## 2018-12-20 DIAGNOSIS — B353 Tinea pedis: Secondary | ICD-10-CM | POA: Diagnosis not present

## 2018-12-20 MED ORDER — CLOTRIMAZOLE-BETAMETHASONE 1-0.05 % EX CREA
1.0000 | TOPICAL_CREAM | Freq: Two times a day (BID) | CUTANEOUS | 0 refills | Status: DC
Start: 2018-12-20 — End: 2020-06-10

## 2018-12-31 NOTE — Progress Notes (Signed)
  Subjective:  Patient ID: Julia Mccullough, female    DOB: 27-Mar-1971,  MRN: [id number]  Chief Complaint  Patient presents with  . Foot Problem    Pt states left plantar foot fungus, dry/cracking skin with occasional pain, 46mo duration.    48 y.o. female presents with the above complaint. Hx as above.   Review of Systems: Negative except as noted in the HPI. Denies N/V/F/Ch.  No past medical history on file.  Current Outpatient Medications:  .  clotrimazole-betamethasone (LOTRISONE) cream, Apply 1 application topically 2 (two) times daily., Disp: 30 g, Rfl: 0 .  HYDROcodone-acetaminophen (NORCO) 5-325 MG tablet, Take 1 tablet by mouth every 4 (four) hours as needed for severe pain. (Patient not taking: Reported on 12/20/2018), Disp: 15 tablet, Rfl: 0  Social History   Tobacco Use  Smoking Status Never Smoker  Smokeless Tobacco Never Used    No Known Allergies Objective:   Vitals:   12/20/18 1538  BP: 102/63  Pulse: 93  Temp: (!) 97.2 F (36.2 C)   There is no height or weight on file to calculate BMI. Constitutional Well developed. Well nourished.  Vascular Dorsalis pedis pulses palpable bilaterally. Posterior tibial pulses palpable bilaterally. Capillary refill normal to all digits.  No cyanosis or clubbing noted. Pedal hair growth normal.  Neurologic Normal speech. Oriented to person, place, and time. Epicritic sensation to light touch grossly present bilaterally.  Dermatologic Nails normal Skin xerosis with scaling with vesicular rash  Orthopedic: Normal joint ROM without pain or crepitus bilaterally. No visible deformities. No bony tenderness.   Radiographs: None Assessment:   1. Tinea pedis of both feet   2. Dermatitis    Plan:  Patient was evaluated and treated and all questions answered.  Inflammatory Tinea -Educated on etiology -Rx lotrisone. Educated on use -Advised if the lesion were to worsen to call for prompt follow-up.  Return in about  4 weeks (around 01/17/2019) for Rash left foot f/u .

## 2019-06-25 ENCOUNTER — Telehealth: Payer: Self-pay | Admitting: Podiatry

## 2019-06-25 NOTE — Telephone Encounter (Signed)
I'm calling to request the office visit notes from 18 June. Please fax those to [phone] attn.: Dorthula Matas.

## 2020-05-23 IMAGING — CT CT NECK W/ CM
4 of 6 series · 13 of 33 positions shown, 15 images · IV contrast (APPLIED)
Comparison: None.

CLINICAL DATA: Neck pain with infection suspected

EXAM:
CT NECK WITH CONTRAST
TECHNIQUE: Multidetector CT imaging of the neck was performed using the
standard protocol following the bolus administration of intravenous
contrast.
CONTRAST:  75mL OMNIPAQUE IOHEXOL 300 MG/ML  SOLN

[Series 5: axial bone · axial · 0.57mm/px · z∈[-255,-189]mm · 2 of 100 slices shown]
[im 34/100  bone]
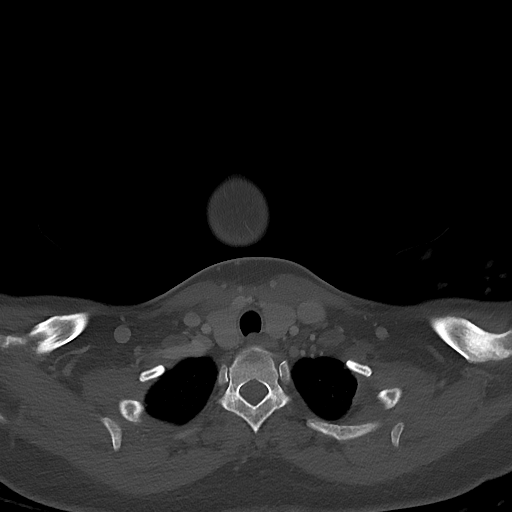
[im 67/100  bone]
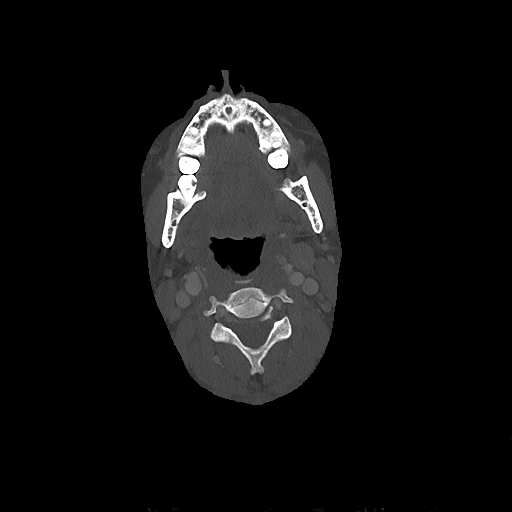

[Series 6: sag neck · sagittal · 0.42mm/px · 5 of 69 slices shown, 6 images]
[im 23/69  bone]
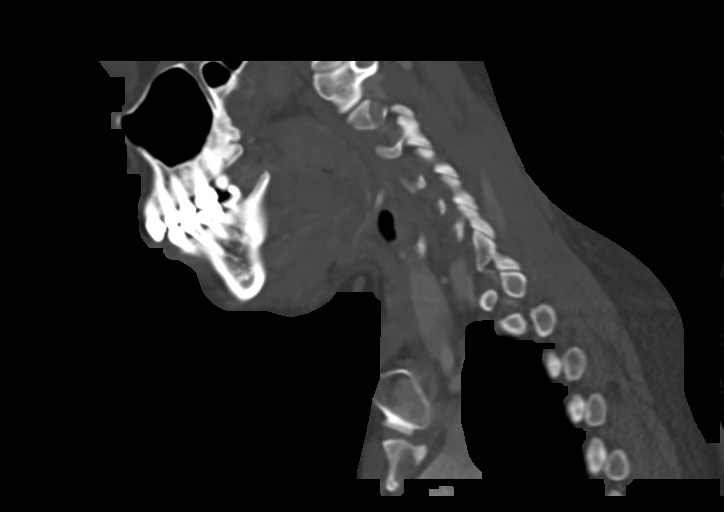
[im 29/69  bone]
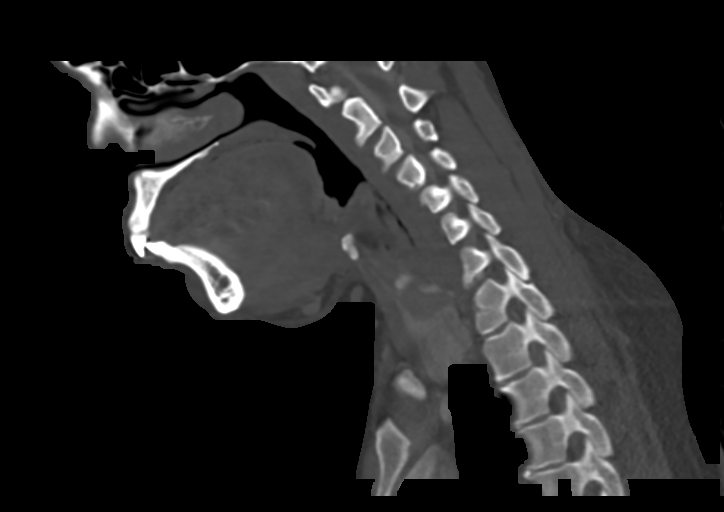
[im 35/69  soft-tissue]
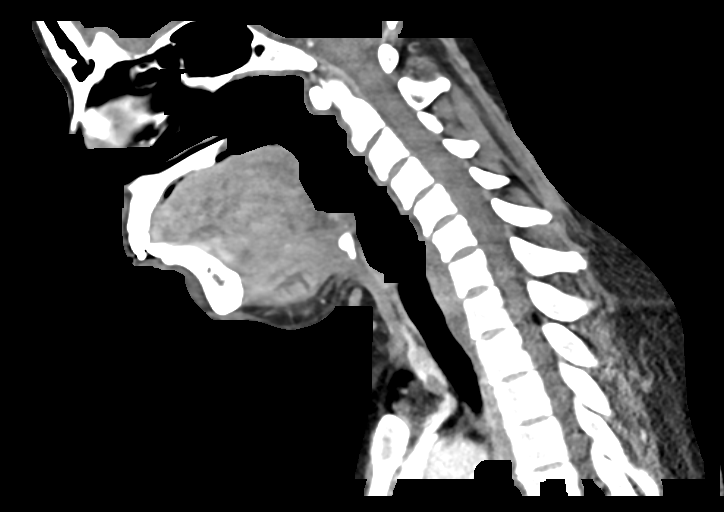
[im 35/69  bone]
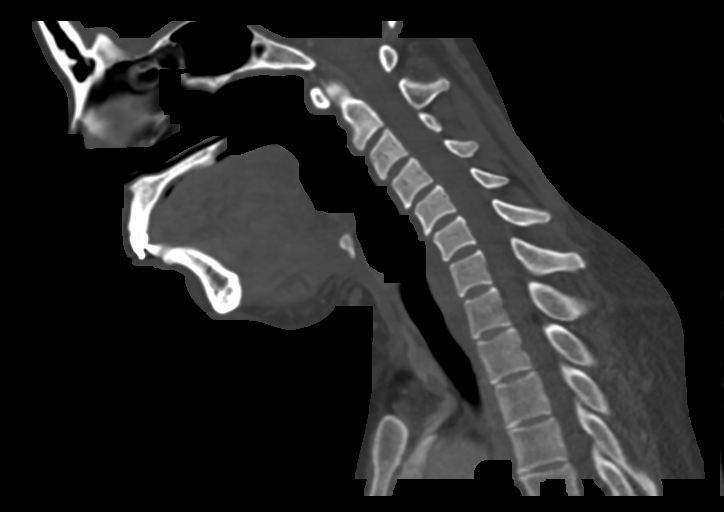
[im 40/69  bone]
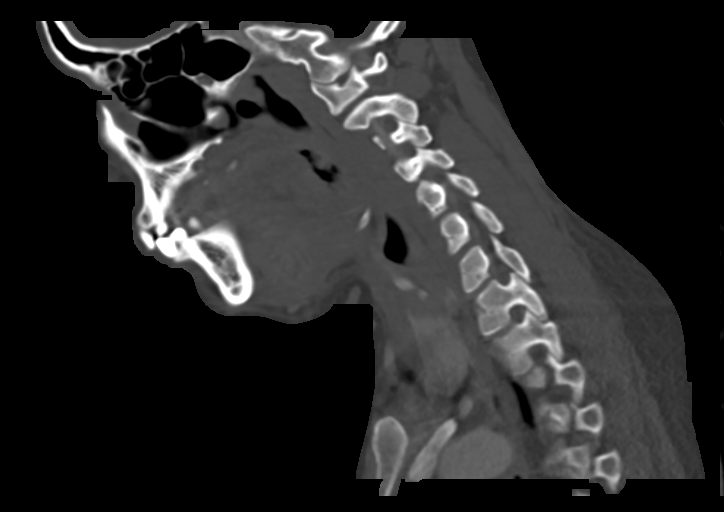
[im 46/69  bone]
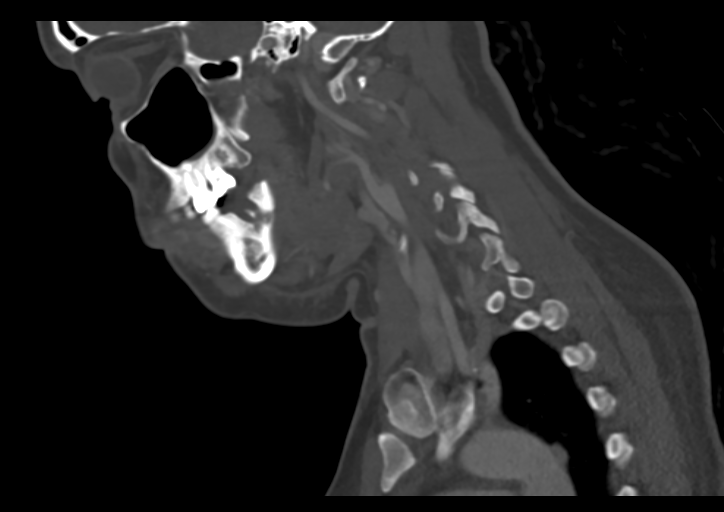

[Series 7: cor neck · coronal · 0.45mm/px · 3 of 112 slices shown]
[im 23/112  bone]
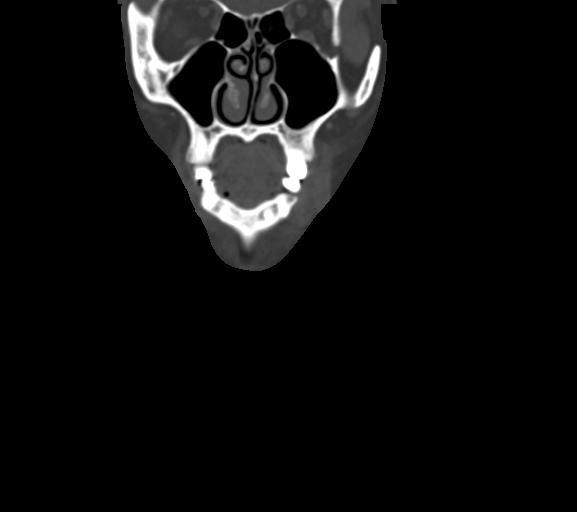
[im 45/112  bone]
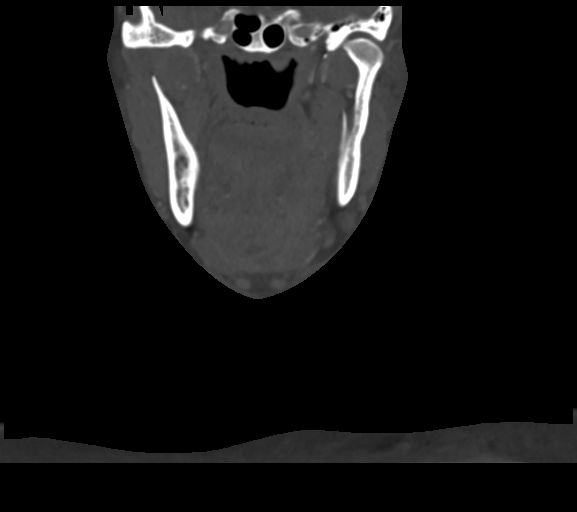
[im 67/112  bone]
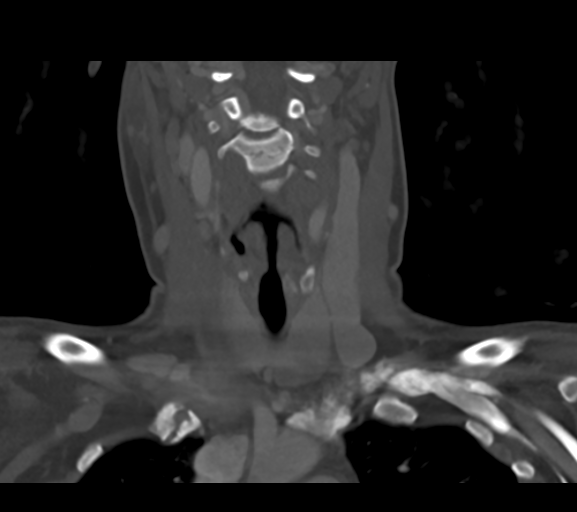

[Series 8: ax oropharynx · axial · 0.47mm/px · z∈[-312,-197]mm · 3 of 119 slices shown, 4 images]
[im 30/119  soft-tissue]
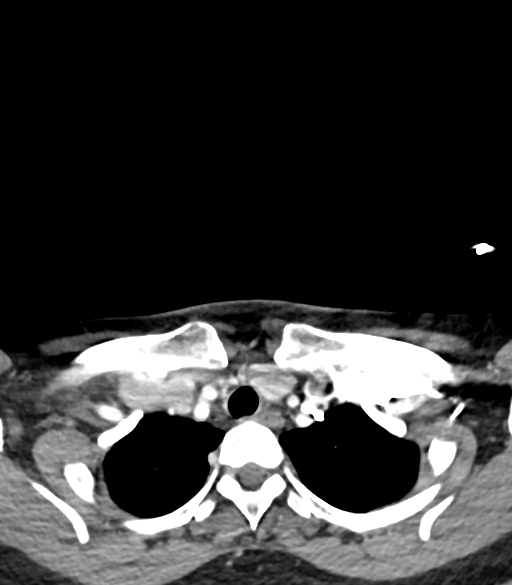
[im 30/119  bone]
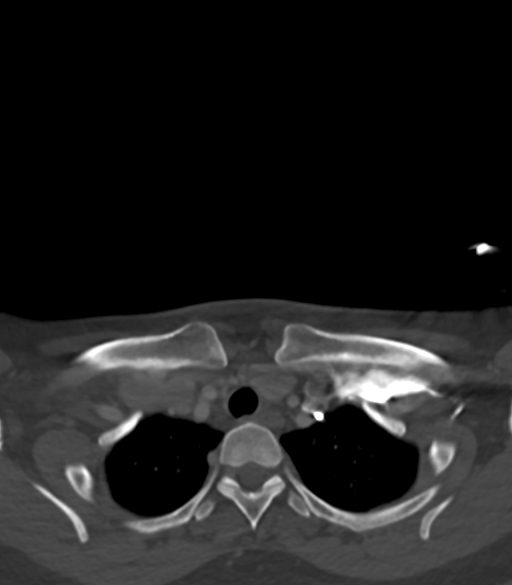
[im 60/119  bone]
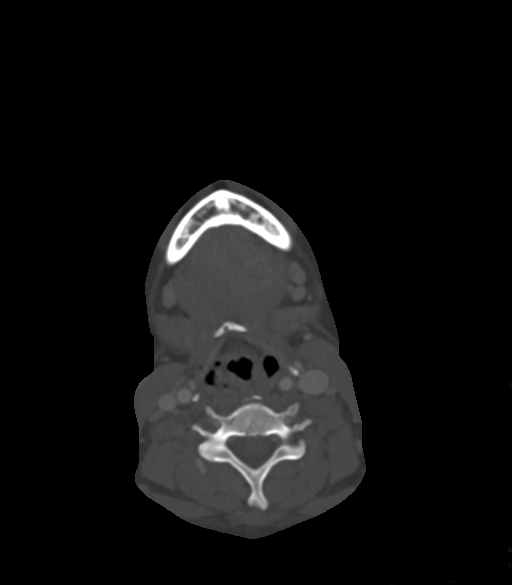
[im 89/119  bone]
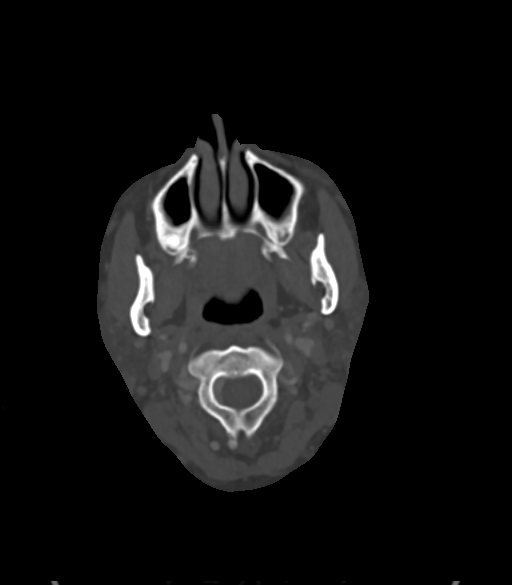

[13 of 33 positions shown; findings below may reference images not displayed]

FINDINGS: Pharynx and larynx: There is a low-density tract best seen on
reformats extending from a left lower molar alveolus and periapical
erosion that decompresses through the medial mandibular cortex.
Tract leads to a collection in the floor of mouth measuring 6 x 12 x
21 mm. The floor of mouth is edematous and thickened. Edema
continues into the epiglottis which is thickened and blunted. The
airway is currently patent

Salivary glands: Negative

Thyroid: Negative

Lymph nodes: Adenitis greater on the left

Vascular: Major veins are patent

Limited intracranial: Negative

Visualized orbits: Negative

Mastoids and visualized paranasal sinuses: Negative

Skeleton: Left lower mandibular alveolus with periapical erosion
decompressing through the medial cortex as described above

Upper chest: Negative.

Other: These results were called by telephone at the time of
interpretation on 12/30/2017 at [DATE] to Dr. KAZINO JALA , who
verbally acknowledged these results. Ludwig's angina was already
clinically suspected.
IMPRESSION: Odontogenic infection from the empty left lower molar alveolus with
sinus tract extending into the floor of mouth where there is 21 x 12
x 6 mm abscess. Floor of mouth cellulitis with edema extending into
the thickened epiglottis.

## 2020-06-10 ENCOUNTER — Telehealth: Payer: Self-pay | Admitting: Podiatry

## 2020-06-10 MED ORDER — CLOTRIMAZOLE-BETAMETHASONE 1-0.05 % EX CREA: 1.0000 "application " | TOPICAL_CREAM | Freq: Two times a day (BID) | CUTANEOUS | 0 refills | Status: DC

## 2020-06-10 NOTE — Telephone Encounter (Signed)
Patient is requesting another refill on (LOTRISONE) cream  , she states infection has come back

## 2020-06-10 NOTE — Addendum Note (Signed)
Addended by: Ventura Sellers on: 06/10/2020 06:08 PM   Modules accepted: Orders

## 2020-06-10 NOTE — Telephone Encounter (Signed)
Refill approved and sent 

## 2021-02-14 ENCOUNTER — Emergency Department (HOSPITAL_COMMUNITY)
Admission: EM | Admit: 2021-02-14 | Discharge: 2021-02-14 | Disposition: A | Discharge status: Home / Self Care | Payer: 59 | Attending: Emergency Medicine | Admitting: Emergency Medicine

## 2021-02-14 ENCOUNTER — Encounter (HOSPITAL_COMMUNITY): Payer: Self-pay | Admitting: Emergency Medicine

## 2021-02-14 DIAGNOSIS — R42 Dizziness and giddiness: Secondary | ICD-10-CM | POA: Diagnosis not present

## 2021-02-14 DIAGNOSIS — R55 Syncope and collapse: Secondary | ICD-10-CM | POA: Insufficient documentation

## 2021-02-14 DIAGNOSIS — Z853 Personal history of malignant neoplasm of breast: Secondary | ICD-10-CM | POA: Diagnosis not present

## 2021-02-14 DIAGNOSIS — R110 Nausea: Secondary | ICD-10-CM | POA: Diagnosis not present

## 2021-02-14 LAB — URINALYSIS, ROUTINE W REFLEX MICROSCOPIC
Bacteria, UA: NONE SEEN
Bilirubin Urine: NEGATIVE
Glucose, UA: NEGATIVE mg/dL
Ketones, ur: NEGATIVE mg/dL
Leukocytes,Ua: NEGATIVE
Nitrite: NEGATIVE
Protein, ur: NEGATIVE mg/dL
Specific Gravity, Urine: 1.017 (ref 1.005–1.030)
pH: 5 (ref 5.0–8.0)

## 2021-02-14 LAB — BASIC METABOLIC PANEL
Anion gap: 7 (ref 5–15)
BUN: 8 mg/dL (ref 6–20)
CO2: 26 mmol/L (ref 22–32)
Calcium: 9.5 mg/dL (ref 8.9–10.3)
Chloride: 103 mmol/L (ref 98–111)
Creatinine, Ser: 0.83 mg/dL (ref 0.44–1.00)
GFR, Estimated: >60 mL/min (ref >60)
Glucose, Bld: 115 mg/dL — ABNORMAL HIGH (ref 70–99)
Potassium: 4 mmol/L (ref 3.5–5.1)
Sodium: 136 mmol/L (ref 135–145)

## 2021-02-14 LAB — CBC
HCT: 34.8 % — ABNORMAL LOW (ref 36.0–46.0)
Hemoglobin: 11.4 g/dL — ABNORMAL LOW (ref 12.0–15.0)
MCH: 29.5 pg (ref 26.0–34.0)
MCHC: 32.8 g/dL (ref 30.0–36.0)
MCV: 89.9 fL (ref 80.0–100.0)
Platelets: 212 10*3/uL (ref 150–400)
RBC: 3.87 MIL/uL (ref 3.87–5.11)
RDW: 12.5 % (ref 11.5–15.5)
WBC: 5.4 10*3/uL (ref 4.0–10.5)
nRBC: 0 % (ref 0.0–0.2)

## 2021-02-14 LAB — CBG MONITORING, ED: Glucose-Capillary: 107 mg/dL — ABNORMAL HIGH (ref 70–99)

## 2021-02-14 LAB — I-STAT BETA HCG BLOOD, ED (MC, WL, AP ONLY): I-stat hCG, quantitative: <5 m[IU]/mL (ref <5)

## 2021-02-14 MED ORDER — SODIUM CHLORIDE 0.9% FLUSH: 3.0000 mL | Freq: Once | INTRAVENOUS | Status: DC

## 2021-02-14 NOTE — ED Triage Notes (Signed)
Pt had syncopal episode this morning in bathroom.  States she was nauseous and her boyfriend said her eyes rolled back and she was not responding.  Denies dizziness.  No symptoms at this time.

## 2021-02-14 NOTE — ED Provider Notes (Signed)
Emergency Medicine Provider Triage Evaluation Note  Julia Mccullough , a 50 y.o. female  was evaluated in triage.  Pt complains of syncopal episode that occurred earlier today. Pt admits that she had just finished intercourse with her significant other, denies particular rough tintercourse. She went to the bathroom to urinate when she began feeling lightheaded. She states while urinating she passed out. She denies hitting head. She has no complaints currently. She does have hx of breast cancer, started chemo 1.5 weeks ago.  Review of Systems  Positive: + syncope Negative: - chest pain, SOB  Physical Exam  BP 111/78 (BP Location: Right Arm)   Pulse (!) 104   Temp 98.3 F (36.8 C)   Resp 16   LMP 02/01/2021   SpO2 97%  Gen:   Awake, no distress   Resp:  Normal effort  MSK:   Moves extremities without difficulty  Other:    Medical Decision Making  Medically screening exam initiated at 12:41 PM.  Appropriate orders placed.  Julia Mccullough was informed that the remainder of the evaluation will be completed by another provider, this initial triage assessment does not replace that evaluation, and the importance of remaining in the ED until their evaluation is complete.  Syncopal episode in cancer patient. Labs ordered at this time. Held off on D dimer given pt has cancer and it will likely be elevated.    Tanda Rockers, PA-C 02/14/21 1243    Sloan Leiter, DO 02/14/21 2203

## 2021-02-14 NOTE — ED Provider Notes (Signed)
[facility] EMERGENCY DEPARTMENT Provider Note   CSN: [id number] Arrival date & time: 02/14/21  1143     History Chief Complaint  Patient presents with   Loss of Consciousness    Julia Mccullough is a 50 y.o. female.   Loss of Consciousness Associated symptoms: no chest pain, no fever, no palpitations, no seizures, no shortness of breath and no vomiting    50 year old female with a past medical history of breast cancer presenting to the emergency department for episode of syncope.  Patient reports that around 6 hours ago, she was standing in the bathroom.  She bent over to do something along the floor, and when she stood back up she felt lightheaded, flushed, nauseated.  Her significant other witnessed the event, and states that she seemed pale, saw her eyes rolled back, and then she passed out.  He was able to lower her to the ground and she did not strike her head.  She does not take any blood thinners.  She states that she could hear him calling her name but she just could not respond.  Right after she woke up, she was immediately back to baseline.  She is never passed out before.  She denies any history of seizures.  She denies any chest pain, shortness of breath either now or preceding the event.  She denies any history of heart disease.  History reviewed. No pertinent past medical history.  Patient Active Problem List   Diagnosis Date Noted   Submental abscess 01/01/2018    Past Surgical History:  Procedure Laterality Date   EYE SURGERY     INCISION AND DRAINAGE ABSCESS N/A 12/30/2017   Procedure: INCISION AND DRAINAGE NECK ABSCESS;  Surgeon: Christia Reading, MD;  Location: [facility] OR;  Service: ENT;  Laterality: N/A;   TUBAL LIGATION     uterine ablation       OB History   No obstetric history on file.     No family history on file.  Social History   Tobacco Use   Smoking status: Never   Smokeless tobacco: Never  Vaping Use   Vaping Use: Never used   Substance Use Topics   Alcohol use: No   Drug use: No    Home Medications Prior to Admission medications   Medication Sig Start Date End Date Taking? Authorizing Provider  clotrimazole-betamethasone (LOTRISONE) cream Apply 1 application topically 2 (two) times daily. 06/10/20   Park Liter, DPM  HYDROcodone-acetaminophen (NORCO) 5-325 MG tablet Take 1 tablet by mouth every 4 (four) hours as needed for severe pain. Patient not taking: Reported on 12/20/2018 12/23/17   Molpus, Jonny Ruiz, MD    Allergies    Patient has no known allergies.  Review of Systems   Review of Systems  Constitutional:  Negative for chills and fever.  HENT:  Negative for ear pain and sore throat.   Eyes:  Negative for pain and visual disturbance.  Respiratory:  Negative for cough and shortness of breath.   Cardiovascular:  Positive for syncope. Negative for chest pain and palpitations.  Gastrointestinal:  Negative for abdominal pain and vomiting.  Genitourinary:  Negative for dysuria and hematuria.  Musculoskeletal:  Negative for arthralgias and back pain.  Skin:  Negative for color change and rash.  Neurological:  Positive for syncope. Negative for seizures.  All other systems reviewed and are negative.  Physical Exam Updated Vital Signs BP 112/80 (BP Location: Right Arm)   Pulse 98   Temp 98.2 F (  36.8 C) (Oral)   Resp 16   LMP 02/01/2021   SpO2 98%   Physical Exam Vitals and nursing note reviewed.  Constitutional:      General: She is not in acute distress.    Appearance: She is well-developed.  HENT:     Head: Normocephalic and atraumatic.  Eyes:     Conjunctiva/sclera: Conjunctivae normal.  Cardiovascular:     Rate and Rhythm: Normal rate and regular rhythm.     Heart sounds: No murmur heard. Pulmonary:     Effort: Pulmonary effort is normal. No respiratory distress.     Breath sounds: Normal breath sounds.  Abdominal:     Palpations: Abdomen is soft.     Tenderness: There is no  abdominal tenderness.  Musculoskeletal:     Cervical back: Neck supple.  Skin:    General: Skin is warm and dry.  Neurological:     Mental Status: She is alert.    ED Results / Procedures / Treatments   Labs (all labs ordered are listed, but only abnormal results are displayed) Labs Reviewed  BASIC METABOLIC PANEL - Abnormal; Notable for the following components:      Result Value   Glucose, Bld 115 (*)    All other components within normal limits  CBC - Abnormal; Notable for the following components:   Hemoglobin 11.4 (*)    HCT 34.8 (*)    All other components within normal limits  URINALYSIS, ROUTINE W REFLEX MICROSCOPIC - Abnormal; Notable for the following components:   Hgb urine dipstick SMALL (*)    All other components within normal limits  CBG MONITORING, ED - Abnormal; Notable for the following components:   Glucose-Capillary 107 (*)    All other components within normal limits  I-STAT BETA HCG BLOOD, ED ([facility], [facility], [facility] ONLY)    EKG EKG Interpretation  Date/Time:  Sunday February 14 2021 11:58:09 EDT Ventricular Rate:  100 PR Interval:  134 QRS Duration: 72 QT Interval:  344 QTC Calculation: 443 R Axis:   56 Text Interpretation: Normal sinus rhythm Nonspecific T wave abnormality Abnormal ECG No old tracing to compare Confirmed by Knapp, Jon ([id number]) on 02/14/2021 5:02:50 PM  Radiology No results found.  Procedures Procedures   Medications Ordered in ED Medications  sodium chloride flush (NS) 0.9 % injection 3 mL (has no administration in time range)   ED Course  I have reviewed the triage vital signs and the nursing notes.  Pertinent labs & imaging results that were available during my care of the patient were reviewed by me and considered in my medical decision making (see chart for details).    MDM Rules/Calculators/A&P                           50  year old female with breast cancer presenting following an episode of syncope.  Vital signs reviewed on  arrival, within acceptable limits.  Physical exam is notable for well-appearing female, no focal neurologic deficits.  The patient describes significant prodromal symptoms that is most consistent with a vasovagal episode.  She denies any sudden abdominal pain, no concern for an ectopic pregnancy, pregnancy negative, no concern for a abdominal aortic aneurysm.  She denies any chest pain, shortness of breath, she has no tachycardia, no hypoxia, despite her back breast cancer her presentation is not consistent with a pulmonary embolism.  CBC obtained in triage, no severe anemia.  Normal blood sugar.  Normal electrolytes.  Pregnancy  negative.  She denies any headache.  She is overall well-appearing.  I believe that she is stable for discharge from the emergency department at this time.  I discussed vasovagal syncope with the patient and her significant other in detail.  I have no concern for a syncope mimic such as seizure.  Strict return precautions discussed. Final Clinical Impression(s) / ED Diagnoses Final diagnoses:  Vasovagal syncope    Rx / DC Orders ED Discharge Orders     None        Lenard Lance, MD 02/14/21 1811    Linwood Dibbles, MD 02/14/21 2129

## 2021-02-14 NOTE — ED Notes (Signed)
Pt verbalized understanding of d/c instructions, meds and followup care. Denies questions. VSS, no distress noted. Steady gait to exit with significant other.

## 2021-03-10 ENCOUNTER — Emergency Department (HOSPITAL_COMMUNITY)
Admission: EM | Admit: 2021-03-10 | Discharge: 2021-03-10 | Disposition: A | Discharge status: Home / Self Care | Payer: 59 | Attending: Emergency Medicine | Admitting: Emergency Medicine

## 2021-03-10 ENCOUNTER — Encounter (HOSPITAL_COMMUNITY): Payer: Self-pay | Admitting: Emergency Medicine

## 2021-03-10 ENCOUNTER — Emergency Department (HOSPITAL_COMMUNITY): Payer: 59

## 2021-03-10 ENCOUNTER — Other Ambulatory Visit: Payer: Self-pay

## 2021-03-10 DIAGNOSIS — R000 Tachycardia, unspecified: Secondary | ICD-10-CM | POA: Diagnosis not present

## 2021-03-10 DIAGNOSIS — R55 Syncope and collapse: Secondary | ICD-10-CM

## 2021-03-10 DIAGNOSIS — Z853 Personal history of malignant neoplasm of breast: Secondary | ICD-10-CM | POA: Diagnosis not present

## 2021-03-10 LAB — COMPREHENSIVE METABOLIC PANEL
ALT: 35 U/L (ref 0–44)
AST: 19 U/L (ref 15–41)
Albumin: 3.6 g/dL (ref 3.5–5.0)
Alkaline Phosphatase: 103 U/L (ref 38–126)
Anion gap: 8 (ref 5–15)
BUN: 13 mg/dL (ref 6–20)
CO2: 24 mmol/L (ref 22–32)
Calcium: 9 mg/dL (ref 8.9–10.3)
Chloride: 102 mmol/L (ref 98–111)
Creatinine, Ser: 0.72 mg/dL (ref 0.44–1.00)
GFR, Estimated: >60 mL/min (ref >60)
Glucose, Bld: 122 mg/dL — ABNORMAL HIGH (ref 70–99)
Potassium: 3.8 mmol/L (ref 3.5–5.1)
Sodium: 134 mmol/L — ABNORMAL LOW (ref 135–145)
Total Bilirubin: 0.4 mg/dL (ref 0.3–1.2)
Total Protein: 6.7 g/dL (ref 6.5–8.1)

## 2021-03-10 LAB — CBC WITH DIFFERENTIAL/PLATELET
Abs Immature Granulocytes: 0.1 10*3/uL — ABNORMAL HIGH (ref 0.00–0.07)
Basophils Absolute: 0.1 10*3/uL (ref 0.0–0.1)
Basophils Relative: 1 %
Eosinophils Absolute: 0.1 10*3/uL (ref 0.0–0.5)
Eosinophils Relative: 1 %
HCT: 34.7 % — ABNORMAL LOW (ref 36.0–46.0)
Hemoglobin: 11.1 g/dL — ABNORMAL LOW (ref 12.0–15.0)
Immature Granulocytes: 1 %
Lymphocytes Relative: 13 %
Lymphs Abs: 0.9 10*3/uL (ref 0.7–4.0)
MCH: 29.6 pg (ref 26.0–34.0)
MCHC: 32 g/dL (ref 30.0–36.0)
MCV: 92.5 fL (ref 80.0–100.0)
Monocytes Absolute: 0.5 10*3/uL (ref 0.1–1.0)
Monocytes Relative: 6 %
Neutro Abs: 5.6 10*3/uL (ref 1.7–7.7)
Neutrophils Relative %: 78 %
Platelets: 197 10*3/uL (ref 150–400)
RBC: 3.75 MIL/uL — ABNORMAL LOW (ref 3.87–5.11)
RDW: 14 % (ref 11.5–15.5)
Smear Review: NORMAL
WBC: 7.2 10*3/uL (ref 4.0–10.5)
nRBC: 0 % (ref 0.0–0.2)

## 2021-03-10 LAB — TROPONIN I (HIGH SENSITIVITY)
Troponin I (High Sensitivity): 9 ng/L (ref <18)
Troponin I (High Sensitivity): 15 ng/L (ref <18)

## 2021-03-10 MED ORDER — IOHEXOL 350 MG/ML SOLN
60.0000 mL | Freq: Once | INTRAVENOUS | Status: AC | PRN
  Administered 2021-03-10: 60 mL via INTRAVENOUS

## 2021-03-10 MED ORDER — SODIUM CHLORIDE 0.9 % IV BOLUS
1000.0000 mL | Freq: Once | INTRAVENOUS | Status: AC
  Administered 2021-03-10: 1000 mL via INTRAVENOUS

## 2021-03-10 NOTE — ED Triage Notes (Signed)
Pt here from work wi c/o syncopal episode , second time in 1 week happened last month also

## 2021-03-10 NOTE — ED Provider Notes (Signed)
Emergency Medicine Provider Triage Evaluation Note  Julia Mccullough , a 50 y.o. female  was evaluated in triage.  Pt complains of syncope. Second time this happened this week. N preceding symptoms.   Review of Systems  Positive:  Negative: Chest pain, shortness of breath, fever, chills, leg pain, leg swelling   Physical Exam  BP 116/60 (BP Location: Right Arm)   Pulse 97   Temp 98.1 F (36.7 C)   Resp 15   SpO2 98%  Gen:   Awake, no distress   Resp:  Normal effort  MSK:   Moves extremities without difficulty  Other:  Heart is RRR. Lungs CTA bilaterally. No pitting edema or tenderness to lower extremities.   Medical Decision Making  Medically screening exam initiated at 12:04 PM.  Appropriate orders placed.  Julia Mccullough was informed that the remainder of the evaluation will be completed by another provider, this initial triage assessment does not replace that evaluation, and the importance of remaining in the ED until their evaluation is complete.     Julia Loh [place], PA-C 03/10/21 1211    Julia Munch, MD 03/10/21 [phone]

## 2021-03-10 NOTE — ED Notes (Signed)
Patient verbalizes understanding of discharge instructions. Opportunity for questioning and answers were provided. Armband removed by staff, pt discharged from ED ambulatory.   

## 2021-03-10 NOTE — ED Notes (Signed)
Pt ambulatory to restroom

## 2021-03-10 NOTE — ED Provider Notes (Addendum)
[facility] EMERGENCY [facility]PART[facility] Provider Note   CSN: [id number] Arrival date & time: 03/10/21  1106     History No chief complaint on file.   Julia BlackerKertina Eckerson is a 50 y.o. female.  50 year old female with history of breast cancer on active chemotherapy presents to the ER secondary to syncope.  Patient reports she is experienced this 3 times over the past month.  Apparently experiences this with minimal exertion, going from a seated to standing position.  Last experienced around-1 hour prior to arrival.  She was evaluated by physician approximate 1 month ago symptoms initially occurred.  She does not follow with cardiology.  Did not occur suddenly, not associate with chest pain, diaphoresis, lightheadedness.  She experienced brief episode feeling like she had to have a bowel movement prior to symptom onset. LOC for 2-3 seconds, no incontinence, no tongue injury or fall to ground. Was sitting in chair as she was attempting to rise when syncope occurred. No myoclonic jerking reported.  No postictal period. No other significant prodromal symptoms.   Initial episode 1 mos ago occurred following having a bowel movement when she stood up and felt light headed, felt like she had to have another bowel movement then experienced syncope x2-3 seconds.   The history is provided by the patient. No language interpreter was used.  Loss of Consciousness Episode history:  Multiple Most recent episode:  Today Duration:  3 seconds Timing:  Intermittent Chronicity:  Recurrent Context: standing up   Witnessed: yes   Associated symptoms: no chest pain, no fever, no headaches, no nausea, no palpitations and no shortness of breath       History reviewed. No pertinent past medical history.  Patient Active Problem List   Diagnosis Date Noted   Submental abscess 01/01/2018    Past Surgical History:  Procedure Laterality Date   EYE SURGERY     INCISION AND DRAINAGE ABSCESS N/A 12/30/2017    Procedure: INCISION AND DRAINAGE NECK ABSCESS;  Surgeon: Christia ReadingBates, Dwight, MD;  [facility];  Service: ENT;  Laterality: N/A;   TUBAL LIGATION     uterine ablation       OB History   No obstetric history on file.     History reviewed. No pertinent family history.  Social History   Tobacco Use   Smoking status: Never   Smokeless tobacco: Never  Vaping Use   Vaping Use: Never used  Substance Use Topics   Alcohol use: No   Drug use: No    Home Medications Prior to Admission medications   Medication Sig Start Date End Date Taking? Authorizing Provider  dexamethasone (DECADRON) 4 MG tablet Take 8 mg by mouth daily. Start the day after chemo and take for 3 days 01/31/21  Yes [provider]  lidocaine-prilocaine (EMLA) cream Apply 1 application topically daily as needed (port site access). 01/31/21  Yes [provider]  ondansetron (ZOFRAN) 8 MG tablet Take 8 mg by mouth every 8 (eight) hours as needed for nausea or vomiting. 01/31/21  Yes [provider]  prochlorperazine (COMPAZINE) 5 MG tablet Take 5 mg by mouth every 6 (six) hours as needed for nausea or vomiting. 01/31/21  Yes [provider]    Allergies    Hydrocodone  Review of Systems   Review of Systems  Constitutional:  Negative for activity change and fever.  HENT:  Negative for facial swelling and trouble swallowing.   Eyes:  Negative for discharge and redness.  Respiratory:  Negative for cough and shortness of breath.   Cardiovascular:  Positive for syncope. Negative for chest pain and palpitations.  Gastrointestinal:  Negative for abdominal pain and nausea.  Genitourinary:  Negative for dysuria and flank pain.  Musculoskeletal:  Negative for back pain and gait problem.  Skin:  Negative for pallor and rash.  Neurological:  Positive for syncope. Negative for headaches.   Physical Exam Updated Vital Signs BP 123/66   Pulse 91   Temp 98.1 F (36.7 C) (Oral)   Resp (!) 21    SpO2 100%   Physical Exam Vitals and nursing note reviewed.  Constitutional:      General: She is not in acute distress.    Appearance: Normal appearance.  HENT:     Head: Normocephalic and atraumatic.     Right Ear: External ear normal.     Left Ear: External ear normal.     Nose: Nose normal.     Mouth/Throat:     Mouth: Mucous membranes are moist.  Eyes:     General: Vision grossly intact. No scleral icterus.       Right eye: No discharge.        Left eye: No discharge.     Extraocular Movements: Extraocular movements intact.     Conjunctiva/sclera: Conjunctivae normal.     Pupils: Pupils are equal, round, and reactive to light.     Comments: Discordant gaze, chronic per pt   Cardiovascular:     Rate and Rhythm: Normal rate and regular rhythm.     Pulses: Normal pulses.     Heart sounds: Normal heart sounds.  Pulmonary:     Effort: Pulmonary effort is normal. No respiratory distress.     Breath sounds: Normal breath sounds.  Abdominal:     General: Abdomen is flat.     Tenderness: There is no abdominal tenderness.  Musculoskeletal:        General: Normal range of motion.     Cervical back: Normal range of motion.     Right lower leg: No edema.     Left lower leg: No edema.  Skin:    General: Skin is warm and dry.     Capillary Refill: Capillary refill takes less than 2 seconds.  Neurological:     Mental Status: She is alert and oriented to person, place, and time.     GCS: GCS eye subscore is 4. GCS verbal subscore is 5. GCS motor subscore is 6.     Cranial Nerves: Cranial nerves are intact. No facial asymmetry.     Sensory: Sensation is intact.     Motor: Motor function is intact. No tremor.     Coordination: Coordination is intact. Coordination normal.     Gait: Gait is intact.  Psychiatric:        Mood and Affect: Mood normal.        Behavior: Behavior normal.    ED Results / Procedures / Treatments   Labs (all labs ordered are listed, but only abnormal  results are displayed) Labs Reviewed  CBC WITH DIFFERENTIAL/PLATELET - Abnormal; Notable for the following components:      Result Value   RBC 3.75 (*)    Hemoglobin 11.1 (*)    HCT 34.7 (*)    Abs Immature Granulocytes 0.10 (*)    All other components within normal limits  COMPREHENSIVE METABOLIC PANEL - Abnormal; Notable for the following components:   Sodium 134 (*)    Glucose, Bld 122 (*)  All other components within normal limits  TROPONIN I (HIGH SENSITIVITY)  TROPONIN I (HIGH SENSITIVITY)    EKG EKG Interpretation  Date/Time:  Wednesday March 10 2021 12:07:37 EDT Ventricular Rate:  102 PR Interval:  138 QRS Duration: 72 QT Interval:  330 QTC Calculation: 430 R Axis:   79 Text Interpretation: Sinus tachycardia Otherwise normal ECG Similar to prior tracing Confirmed by Tanda Rockers ([id number]) on 03/10/2021 5:07:01 PM  Radiology CT Angio Chest PE W and/or Wo Contrast  Result Date: 03/10/2021 CLINICAL DATA:  Syncope EXAM: CT ANGIOGRAPHY CHEST WITH CONTRAST TECHNIQUE: Multidetector CT imaging of the chest was performed using the standard protocol during bolus administration of intravenous contrast. Multiplanar CT image reconstructions and MIPs were obtained to evaluate the vascular anatomy. CONTRAST:  17mL OMNIPAQUE IOHEXOL 350 MG/ML SOLN COMPARISON:  None. FINDINGS: Cardiovascular: Satisfactory opacification of the pulmonary arteries to the segmental level. No evidence of pulmonary embolism. Normal caliber thoracic aorta without evidence of dissection. Normal heart size. No pericardial effusion. Mediastinum/Nodes: Bilateral thyroid nodules measuring up to 1.5 cm on the left. No pathologically enlarged mediastinal, hilar or axillary lymph nodes. Trachea and esophagus are unremarkable. Lungs/Pleura: No suspicious pulmonary nodules or masses. No pleural effusion. No pneumothorax. Hypoventilatory change in the dependent lungs. Upper Abdomen: Scattered fluid density hepatic lesions  measuring up to 1.3 cm, consistent with hepatic cysts. No acute abnormality. Musculoskeletal: No chest wall abnormality. No acute or significant osseous findings. Review of the MIP images confirms the above findings. IMPRESSION: 1. Negative for pulmonary embolism or other acute intrathoracic findings. 2. Bilateral thyroid nodules measuring up to 1.5 cm on the left. Suggest further evaluation with nonemergent thyroid US (ref: J Am Coll Radiol. 2015 Feb;12(2): 143-50). Electronically Signed   By: Maudry Mayhew M.D.   On: 03/10/2021 16:32    Procedures Procedures   Medications Ordered in ED Medications  iohexol (OMNIPAQUE) 350 MG/ML injection 60 mL (60 mLs Intravenous Contrast Given 03/10/21 1609)  sodium chloride 0.9 % bolus 1,000 mL (0 mLs Intravenous Stopped 03/10/21 2124)    ED Course  I have reviewed the triage vital signs and the nursing notes.  Pertinent labs & imaging results that were available during my care of the patient were reviewed by me and considered in my medical decision making (see chart for details).    MDM Rules/Calculators/A&P                           This patient complains of syncope; this involves an extensive number of treatment options and is a complaint that carries with it a high risk of complications and Morbidity.  Vital signs reviewed and are stable.  No external signs of trauma.  Neurologic exam is nonfocal.  Serious etiologies considered.   Moderate risk Wells score, CT PE was obtained.  No evidence of acute PE.  ECG reviewed, no evidence acute ischemia.  Troponin negative x2, no chest pain.   Labs otherwise unremarkable.  Patient given IV fluids, placed on cardiac monitoring.  She has been ambulatory in the ED without difficulty, no re-occurrence of symptoms.     Patient presents with syncopal symptoms without worrisome features. Presentation most suggestive of neuro-cardiogenic or orthostatic cause. Very low suspicion for serious arrhythmia, cardiac  ischemia or other serious etiology. ECG reviewed, no evidence of a cardiac arrhythmia such as Brugada, WPW, HOCM, IHSS, Long or short QT. Neurologic exam is nonfocal, not consistent with CVA or primary neurologic abnormality. Patient appears  safe for discharge with outpatient observation and close PCP F/U. Do recommend cardiology f/u with holter monitor evaluation given recurrence of symptoms. Syncope warnings discussed with patient. The patient has been instructed to return immediately if the symptoms worsen in any way. Patient verbalized understanding and is in agreement with current care plan. All questions answered prior to discharge.  Final Clinical Impression(s) / ED Diagnoses Final diagnoses:  Syncope, unspecified syncope type    Rx / DC Orders ED Discharge Orders     None        Sloan Leiter, DO 03/10/21 2339    Sloan Leiter, DO 03/10/21 2341

## 2021-03-11 ENCOUNTER — Telehealth (HOSPITAL_COMMUNITY): Payer: Self-pay | Admitting: Emergency Medicine

## 2021-03-11 DIAGNOSIS — I2699 Other pulmonary embolism without acute cor pulmonale: Secondary | ICD-10-CM

## 2021-03-11 MED ORDER — APIXABAN (ELIQUIS) VTE STARTER PACK (10MG AND 5MG)
ORAL_TABLET | ORAL | 0 refills | Status: AC
Start: 2021-03-11 — End: ?

## 2021-03-11 NOTE — Telephone Encounter (Signed)
Over read of CT PE study from yesterday found multiple small PEs.  No right heart strain.  I discussed with patient she states she has no chest pain or shortness of breath  I discussed with her oncology PA See prior note in EMR for name.  Discussed with patient she will come to the ER for education and coupon from pharmacy. Julia Mccullough will counsel.

## 2021-03-11 NOTE — ED Provider Notes (Addendum)
Received call from Dr. Nadene Rubins of radiology  CT PE study - shows multiple small Pes no right heart strain.  Called pt to inform and request return to ER  Discussed with pharmacy jonathan  no major interactions   Eliquis preferred.   Dimas [place] - will see on 16th. Onc PA. OK with starting eliquis   Discussed with [place] pt's diagnosis.    Gailen Shelter, [place] 03/11/21 1325    Patient came to ER for education with pharmacist Filbert Schilder  I sent prescription for Eliquis to [facility] on [address]. Patient denies any chest pain shortness of breath.   Solon Augusta [place], [place] 03/11/21 1425    Gwyneth Sprout, MD 03/11/21 1459

## 2022-08-19 ENCOUNTER — Emergency Department (HOSPITAL_COMMUNITY)
Admission: EM | Admit: 2022-08-19 | Discharge: 2022-08-20 | Disposition: A | Discharge status: Home / Self Care | Payer: 59 | Attending: Emergency Medicine | Admitting: Emergency Medicine

## 2022-08-19 ENCOUNTER — Other Ambulatory Visit: Payer: Self-pay

## 2022-08-19 ENCOUNTER — Emergency Department (HOSPITAL_COMMUNITY): Payer: 59

## 2022-08-19 DIAGNOSIS — Y92838 Other recreation area as the place of occurrence of the external cause: Secondary | ICD-10-CM | POA: Diagnosis not present

## 2022-08-19 DIAGNOSIS — S4992XA Unspecified injury of left shoulder and upper arm, initial encounter: Secondary | ICD-10-CM | POA: Diagnosis present

## 2022-08-19 DIAGNOSIS — Y9351 Activity, roller skating (inline) and skateboarding: Secondary | ICD-10-CM | POA: Diagnosis not present

## 2022-08-19 DIAGNOSIS — S42402A Unspecified fracture of lower end of left humerus, initial encounter for closed fracture: Secondary | ICD-10-CM

## 2022-08-19 NOTE — ED Provider Triage Note (Signed)
Emergency Medicine Provider Triage Evaluation Note  Julia Mccullough , a 52 y.o. female  was evaluated in triage.  Pt complains of left elbow injury while skating. [address] and thought her elbow popped out of socket went back in.   Review of Systems  Positive: Left elbow pain Negative:   Physical Exam  BP 119/67   Pulse 88   Temp 98.3 F (36.8 C) (Oral)   Resp 18   SpO2 99%  Gen:   Awake, no distress   Resp:  Normal effort  MSK:   Moves extremities without difficulty  Other:  Normal sensation, holding elbow in position of comfort, no deformities palpated  Medical Decision Making  Medically screening exam initiated at 9:42 PM.  Appropriate orders placed.  Julia Mccullough was informed that the remainder of the evaluation will be completed by another provider, this initial triage assessment does not replace that evaluation, and the importance of remaining in the ED until their evaluation is complete.     Kateri Plummer, Hershal [place] 08/19/22 2144

## 2022-08-19 NOTE — ED Triage Notes (Signed)
Patient reports injury to left elbow with pain and limited ROM after she fell and landed on her left side this evening while skating  , no LOC/ambulatory .

## 2022-08-20 MED ORDER — OXYCODONE-ACETAMINOPHEN 5-325 MG PO TABS
2.0000 | ORAL_TABLET | Freq: Once | ORAL | Status: AC
  Administered 2022-08-20: 2 via ORAL
  Filled 2022-08-20: qty 2

## 2022-08-20 MED ORDER — OXYCODONE-ACETAMINOPHEN 5-325 MG PO TABS: 1.0000 | ORAL_TABLET | ORAL | 0 refills | Status: AC | PRN

## 2022-08-20 NOTE — Discharge Instructions (Signed)
Use the sling until seen by the orthopedic.  We gave you a splint to help with pain tonight, but it's recommended that you remove it tomorrow or the next day to keep the elbow from stiffening up.  You'll need to keep wearing the sling.  Take medications as directed.

## 2022-08-20 NOTE — ED Provider Notes (Signed)
[facility] Emergency Department Provider Note MRN:  [id number]  Arrival date & time: 08/20/22     Chief Complaint   Elbow Injury   History of Present Illness   Julia Mccullough is a 52 y.o. year-old female presents to the ED with chief complaint of left elbow pain.  She states that she was roller skating tonight and fell backward hitting her left elbow on the ground.  She states that it felt like it dislocated then went back into place.  History provided by patient.   Review of Systems  Pertinent positive and negative review of systems noted in HPI.    Physical Exam   Vitals:   08/19/22 2122  BP: 119/67  Pulse: 88  Resp: 18  Temp: 98.3 F (36.8 C)  SpO2: 99%    CONSTITUTIONAL:  well-appearing, NAD NEURO:  Alert and oriented x 3, CN 3-12 grossly intact EYES:  eyes equal and reactive ENT/NECK:  Supple, no stridor  CARDIO:  normal rate, regular rhythm, appears well-perfused, intact distal pulses and sensation PULM:  No respiratory distress,  GI/GU:  non-distended,  MSK/SPINE:  Left elbow TTP, ROM limited SKIN:  no rash, atraumatic   *Additional and/or pertinent findings included in MDM below  Diagnostic and Interventional Summary    EKG Interpretation  Date/Time:    Ventricular Rate:    PR Interval:    QRS Duration:   QT Interval:    QTC Calculation:   R Axis:     Text Interpretation:         Labs Reviewed - No data to display  DG Elbow Complete Left  Final Result      Medications  oxyCODONE-acetaminophen (PERCOCET/ROXICET) 5-325 MG per tablet 2 tablet (has no administration in time range)     Procedures  /  Critical Care Procedures  ED Course and Medical Decision Making  I have reviewed the triage vital signs, the nursing notes, and pertinent available records from the EMR.  Social Determinants Affecting Complexity of Care: Patient has no clinically significant social determinants affecting this chief  complaint..   ED Course:    Medical Decision Making Risk Prescription drug management.     Consultants: I discussed the case with Dr. Stann Mainland, who recommends sling and follow-up in clinic in about a week.   Treatment and Plan: Emergency department workup does not suggest an emergent condition requiring admission or immediate intervention beyond  what has been performed at this time. The patient is safe for discharge and has  been instructed to return immediately for worsening symptoms, change in  symptoms or any other concerns    Final Clinical Impressions(s) / ED Diagnoses     ICD-10-CM   1. Closed fracture of left elbow, initial encounter  S42.402A       ED Discharge Orders          Ordered    oxyCODONE-acetaminophen (PERCOCET) 5-325 MG tablet  Every 4 hours PRN        08/20/22 0139              Discharge Instructions Discussed with and Provided to Patient:     Discharge Instructions      Use the sling until seen by the orthopedic.  We gave you a splint to help with pain tonight, but it's recommended that you remove it tomorrow or the next day to keep the elbow from stiffening up.  You'll need to keep wearing the sling.  Take medications as directed.  Montine Circle, PA-C 08/20/22 0144    Ripley Fraise, MD 08/20/22 [phone]

## 2023-08-02 IMAGING — CT CT ANGIO CHEST
2 of 6 series · 15 of 36 positions shown · IV contrast (omnipaque)
Comparison: None.
COMPARISON: None.
COMPARISON: None.

Addendum:
CLINICAL DATA: Syncope

EXAM:
CT ANGIOGRAPHY CHEST WITH CONTRAST
TECHNIQUE: Multidetector CT imaging of the chest was performed using the
standard protocol during bolus administration of intravenous
contrast. Multiplanar CT image reconstructions and MIPs were
obtained to evaluate the vascular anatomy.
CONTRAST:  60mL OMNIPAQUE IOHEXOL 350 MG/ML SOLN

[Series 7: pe thins · axial · 0.58mm/px · z∈[+1144,+1378]mm · 14 of 373 slices shown]
[im 19/373  lung]
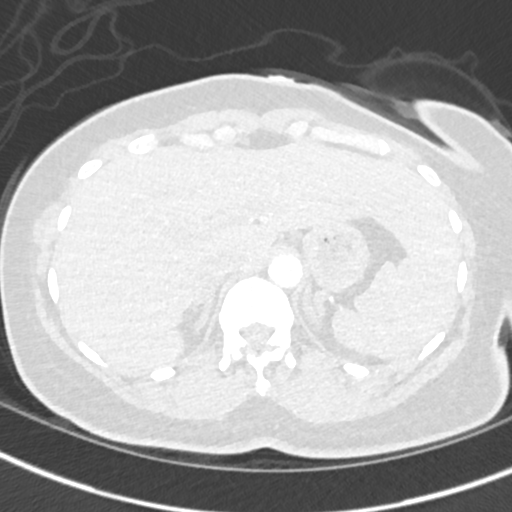
[im 56/373  mediastinal]
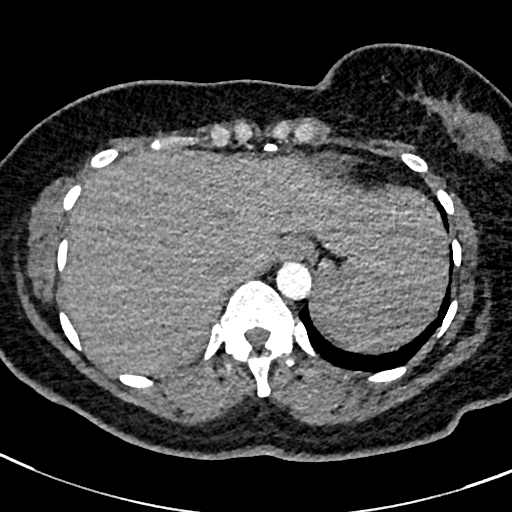
[im 75/373  lung]
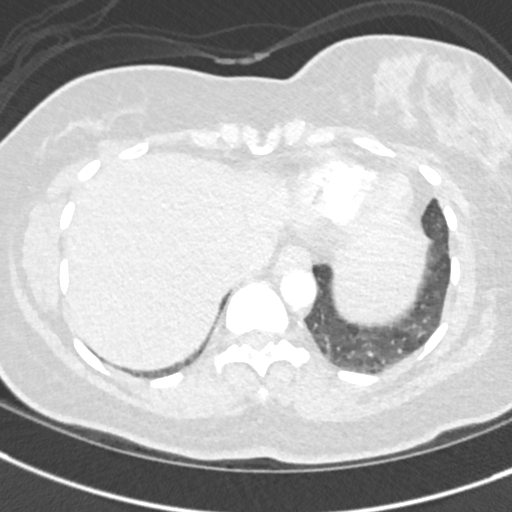
[im 94/373  mediastinal]
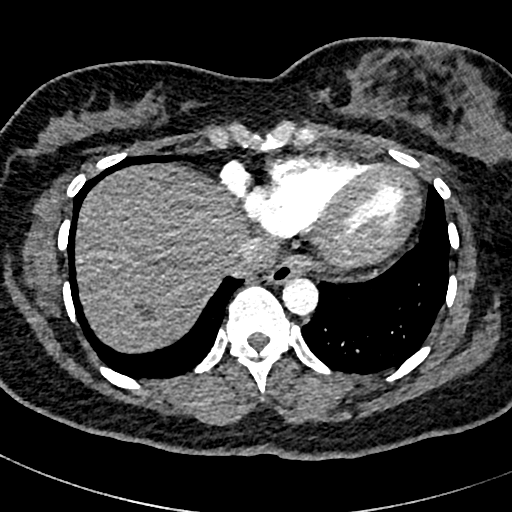
[im 131/373  lung]
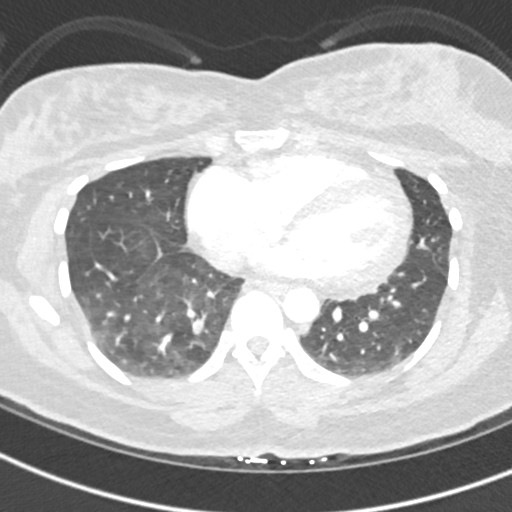
[im 149/373  mediastinal]
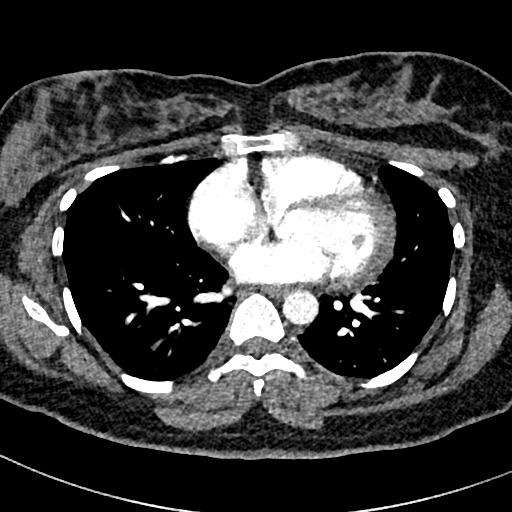
[im 168/373  lung]
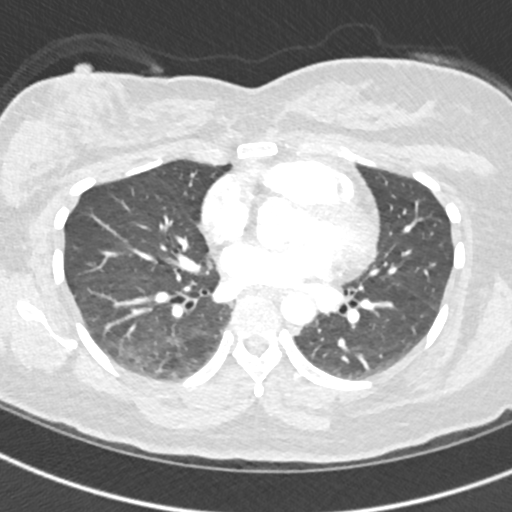
[im 205/373  mediastinal]
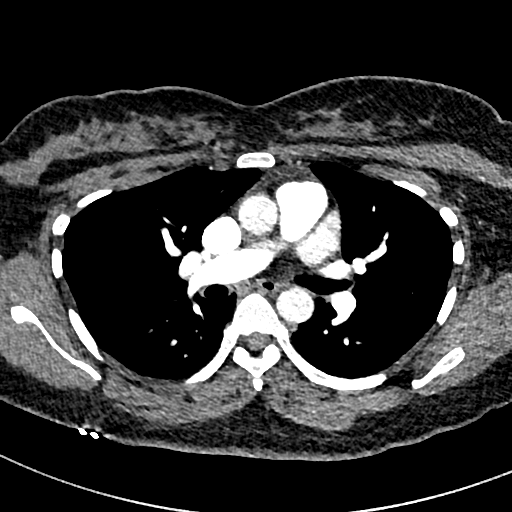
[im 224/373  lung]
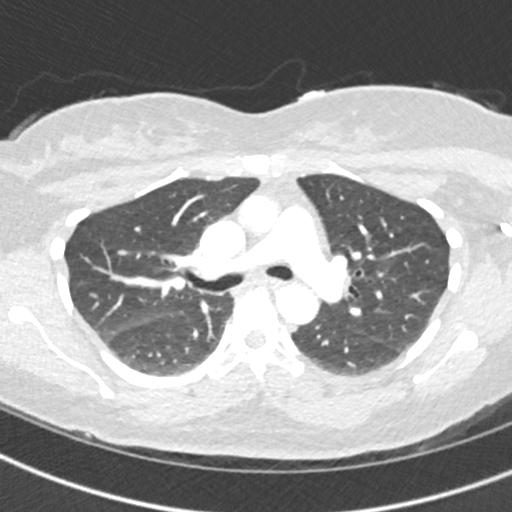
[im 242/373  mediastinal]
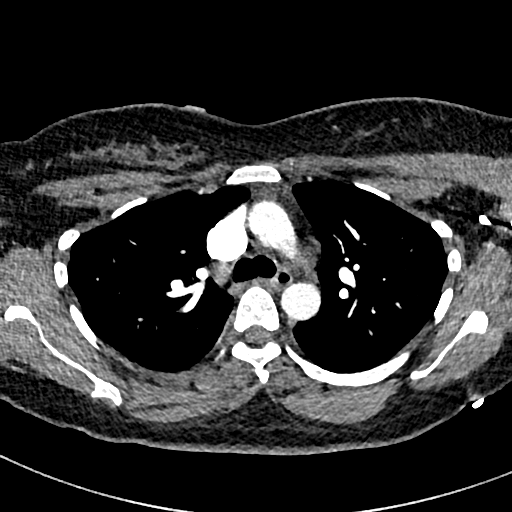
[im 280/373  lung]
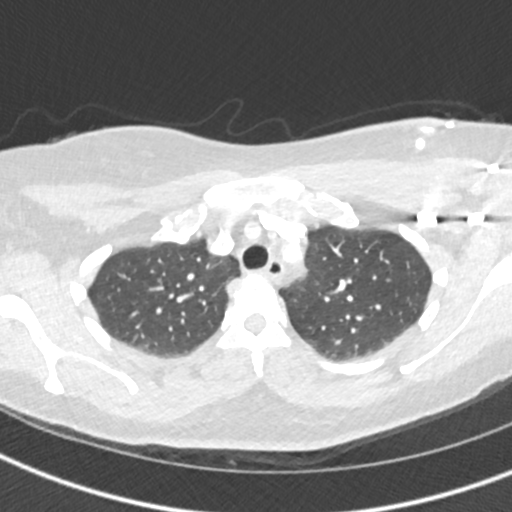
[im 298/373  mediastinal]
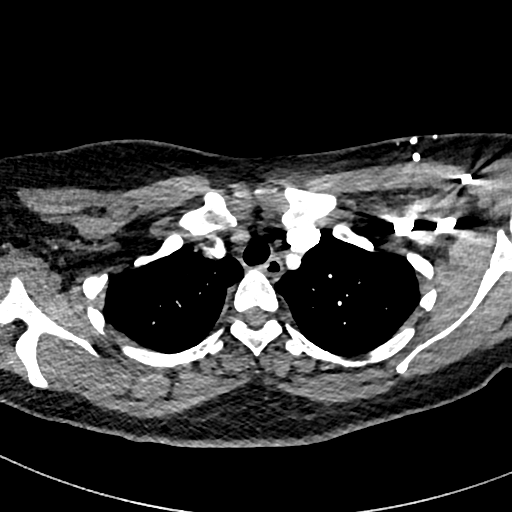
[im 317/373  lung]
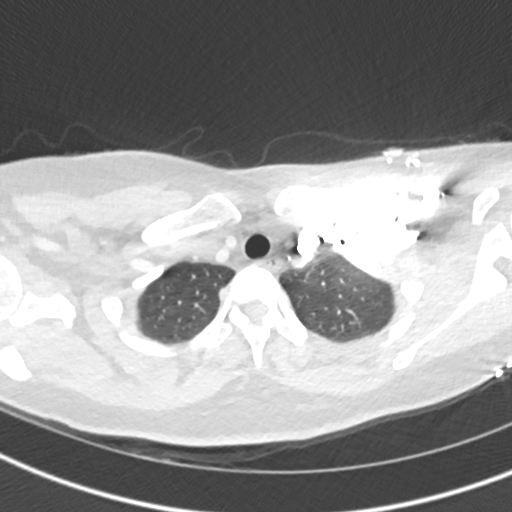
[im 354/373  mediastinal]
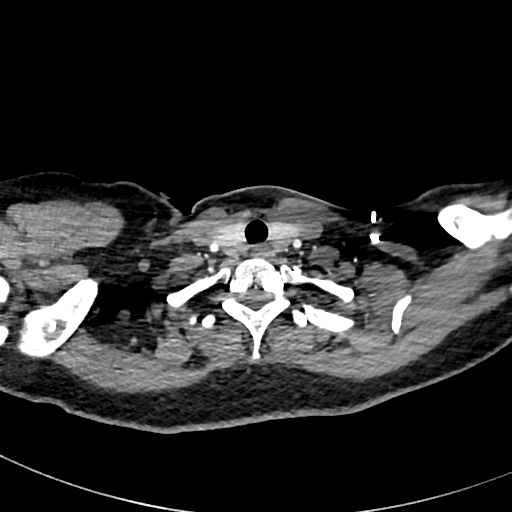

[Series 8: pe 2mm cor · coronal · 0.52mm/px · 1 of 123 slices shown]
[im 62/123  mediastinal]
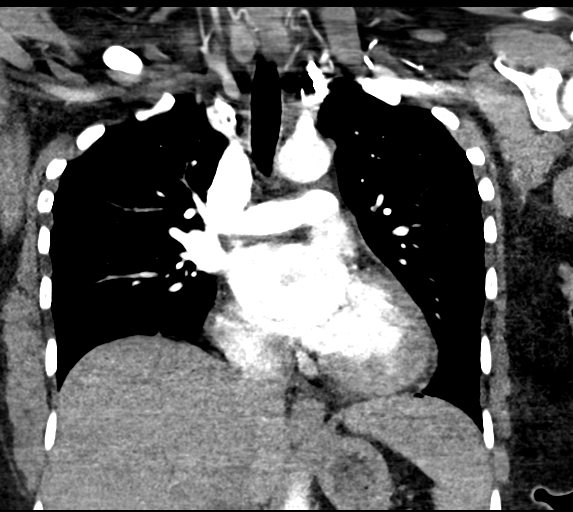

[15 of 36 positions shown; findings below may reference images not displayed]

FINDINGS: Cardiovascular: Satisfactory opacification of the pulmonary arteries
to the segmental level. No evidence of pulmonary embolism. Normal
caliber thoracic aorta without evidence of dissection. Normal heart
size. No pericardial effusion.

Mediastinum/Nodes: Bilateral thyroid nodules measuring up to 1.5 cm
on the left. No pathologically enlarged mediastinal, hilar or
axillary lymph nodes. Trachea and esophagus are unremarkable.

Lungs/Pleura: No suspicious pulmonary nodules or masses. No pleural
effusion. No pneumothorax. Hypoventilatory change in the dependent
lungs.

Upper Abdomen: Scattered fluid density hepatic lesions measuring up
to 1.3 cm, consistent with hepatic cysts. No acute abnormality.

Musculoskeletal: No chest wall abnormality. No acute or significant
osseous findings.

Review of the MIP images confirms the above findings.
IMPRESSION: 1. Negative for pulmonary embolism or other acute intrathoracic
findings.
2. Bilateral thyroid nodules measuring up to 1.5 cm on the left.
Suggest further evaluation with nonemergent thyroid US (ref: [HOSPITAL]. [DATE]): 143-50).

ADDENDUM:
Upon re-review of pulmonary embolism evaluation performed March 10, 2021., The following findings are noted:

(Image 128 and 127 of series 7) small segmental embolus extending
into the subsegmental branches of posterior RIGHT upper lobe.

Small subsegmental embolus in the medial RIGHT lung base (image
244/7) also seen on image 83/8.

Subsegmental tiny embolus on image 80/10 also small RIGHT lower lobe
embolus on image 74/10. Small subsegmental embolus on image 80/10 in
the LEFT lower lobe.

Tiny subsegmental embolus in the superior segment of the RIGHT lower
lobe (image 77/10.

In summary there are small predominantly subsegmental emboli
scattered throughout the RIGHT lung greater than LEFT. Peripheral
segmental embolism in the posterior RIGHT upper lobe.

A call is out to the referring provider to further discuss findings
in the above case.

ADDENDUM:
These results were called by telephone at the time of interpretation
on 03/11/2021 at [DATE] to provider Hasan Emre Mathers, who verbally
acknowledged these results.

*** End of Addendum ***
Addendum:
FINDINGS: Cardiovascular: Satisfactory opacification of the pulmonary arteries
to the segmental level. No evidence of pulmonary embolism. Normal
caliber thoracic aorta without evidence of dissection. Normal heart
size. No pericardial effusion.

Mediastinum/Nodes: Bilateral thyroid nodules measuring up to 1.5 cm
on the left. No pathologically enlarged mediastinal, hilar or
axillary lymph nodes. Trachea and esophagus are unremarkable.

Lungs/Pleura: No suspicious pulmonary nodules or masses. No pleural
effusion. No pneumothorax. Hypoventilatory change in the dependent
lungs.

Upper Abdomen: Scattered fluid density hepatic lesions measuring up
to 1.3 cm, consistent with hepatic cysts. No acute abnormality.

Musculoskeletal: No chest wall abnormality. No acute or significant
osseous findings.

Review of the MIP images confirms the above findings.
IMPRESSION: 1. Negative for pulmonary embolism or other acute intrathoracic
findings.
2. Bilateral thyroid nodules measuring up to 1.5 cm on the left.
Suggest further evaluation with nonemergent thyroid US (ref: [HOSPITAL]. [DATE]): 143-50).

ADDENDUM:
Upon re-review of pulmonary embolism evaluation performed March 10, 2021., The following findings are noted:

(Image 128 and 127 of series 7) small segmental embolus extending
into the subsegmental branches of posterior RIGHT upper lobe.

Small subsegmental embolus in the medial RIGHT lung base (image
244/7) also seen on image 83/8.

Subsegmental tiny embolus on image 80/10 also small RIGHT lower lobe
embolus on image 74/10. Small subsegmental embolus on image 80/10 in
the LEFT lower lobe.

Tiny subsegmental embolus in the superior segment of the RIGHT lower
lobe (image 77/10.

In summary there are small predominantly subsegmental emboli
scattered throughout the RIGHT lung greater than LEFT. Peripheral
segmental embolism in the posterior RIGHT upper lobe.

A call is out to the referring provider to further discuss findings
in the above case.

*** End of Addendum ***
FINDINGS: Cardiovascular: Satisfactory opacification of the pulmonary arteries
to the segmental level. No evidence of pulmonary embolism. Normal
caliber thoracic aorta without evidence of dissection. Normal heart
size. No pericardial effusion.

Mediastinum/Nodes: Bilateral thyroid nodules measuring up to 1.5 cm
on the left. No pathologically enlarged mediastinal, hilar or
axillary lymph nodes. Trachea and esophagus are unremarkable.

Lungs/Pleura: No suspicious pulmonary nodules or masses. No pleural
effusion. No pneumothorax. Hypoventilatory change in the dependent
lungs.

Upper Abdomen: Scattered fluid density hepatic lesions measuring up
to 1.3 cm, consistent with hepatic cysts. No acute abnormality.

Musculoskeletal: No chest wall abnormality. No acute or significant
osseous findings.

Review of the MIP images confirms the above findings.
IMPRESSION: 1. Negative for pulmonary embolism or other acute intrathoracic
findings.
2. Bilateral thyroid nodules measuring up to 1.5 cm on the left.
Suggest further evaluation with nonemergent thyroid US (ref: [HOSPITAL]. [DATE]): 143-50).
